# Patient Record
Sex: Female | Born: 1975
Health system: Southern US, Community
[De-identification: ages and names within clinical notes are randomized; demographics above are authoritative.]

## PROBLEM LIST (undated history)

## (undated) DIAGNOSIS — E282 Polycystic ovarian syndrome: Secondary | ICD-10-CM

## (undated) DIAGNOSIS — T8859XA Other complications of anesthesia, initial encounter: Secondary | ICD-10-CM

## (undated) DIAGNOSIS — R51 Headache: Secondary | ICD-10-CM

## (undated) DIAGNOSIS — R519 Headache, unspecified: Secondary | ICD-10-CM

## (undated) DIAGNOSIS — M1990 Unspecified osteoarthritis, unspecified site: Secondary | ICD-10-CM

## (undated) DIAGNOSIS — I498 Other specified cardiac arrhythmias: Secondary | ICD-10-CM

## (undated) DIAGNOSIS — N809 Endometriosis, unspecified: Secondary | ICD-10-CM

## (undated) DIAGNOSIS — T4145XA Adverse effect of unspecified anesthetic, initial encounter: Secondary | ICD-10-CM

## (undated) DIAGNOSIS — J189 Pneumonia, unspecified organism: Secondary | ICD-10-CM

## (undated) DIAGNOSIS — Z923 Personal history of irradiation: Secondary | ICD-10-CM

## (undated) DIAGNOSIS — E049 Nontoxic goiter, unspecified: Secondary | ICD-10-CM

## (undated) DIAGNOSIS — C801 Malignant (primary) neoplasm, unspecified: Secondary | ICD-10-CM

## (undated) DIAGNOSIS — Z9221 Personal history of antineoplastic chemotherapy: Secondary | ICD-10-CM

## (undated) DIAGNOSIS — D612 Aplastic anemia due to other external agents: Secondary | ICD-10-CM

## (undated) DIAGNOSIS — D649 Anemia, unspecified: Secondary | ICD-10-CM

## (undated) DIAGNOSIS — L4050 Arthropathic psoriasis, unspecified: Secondary | ICD-10-CM

## (undated) HISTORY — PX: MOUTH SURGERY: SHX715

## (undated) HISTORY — PX: ADENOIDECTOMY: SUR15

## (undated) HISTORY — PX: MYRINGOTOMY: SUR874

## (undated) HISTORY — PX: DILATION AND EVACUATION: SHX1459

## (undated) HISTORY — PX: ENDOMETRIAL ABLATION: SHX621

## (undated) HISTORY — PX: DIAGNOSTIC LAPAROSCOPY: SUR761

## (undated) HISTORY — PX: DILATION AND CURETTAGE OF UTERUS: SHX78

## (undated) HISTORY — PX: TONSILLECTOMY: SUR1361

---

## 2016-09-04 ENCOUNTER — Emergency Department (HOSPITAL_COMMUNITY)
Admission: EM | Admit: 2016-09-04 | Discharge: 2016-09-04 | Disposition: A | Discharge status: Home / Self Care | Payer: 59 | Attending: Emergency Medicine | Admitting: Emergency Medicine

## 2016-09-04 ENCOUNTER — Encounter (HOSPITAL_COMMUNITY): Payer: Self-pay

## 2016-09-04 DIAGNOSIS — M549 Dorsalgia, unspecified: Secondary | ICD-10-CM | POA: Insufficient documentation

## 2016-09-04 DIAGNOSIS — Z5321 Procedure and treatment not carried out due to patient leaving prior to being seen by health care provider: Secondary | ICD-10-CM | POA: Insufficient documentation

## 2016-09-04 HISTORY — DX: Unspecified osteoarthritis, unspecified site: M19.90

## 2016-09-04 HISTORY — DX: Polycystic ovarian syndrome: E28.2

## 2016-09-04 LAB — URINALYSIS, ROUTINE W REFLEX MICROSCOPIC
Bilirubin Urine: NEGATIVE
GLUCOSE, UA: NEGATIVE mg/dL
Nitrite: POSITIVE — AB
PH: 6.5 (ref 5.0–8.0)
Specific Gravity, Urine: 1.01 (ref 1.005–1.030)

## 2016-09-04 LAB — URINE MICROSCOPIC-ADD ON

## 2016-09-04 NOTE — ED Triage Notes (Signed)
Pt c/o back pain that started on Wednesday. Pt stated she started her period yesterday and ever since the pain has gotten worse. Pt states "it feels like a crapping, stabbing pain in my ovaries"

## 2016-09-04 NOTE — ED Notes (Signed)
Pt was called at triage x 3 without answer

## 2016-12-10 ENCOUNTER — Other Ambulatory Visit: Payer: Self-pay | Admitting: *Deleted

## 2016-12-10 MED ORDER — TRAMADOL HCL 50 MG PO TABS: 50.0000 mg | ORAL_TABLET | Freq: Four times a day (QID) | ORAL | 0 refills | Status: AC | PRN

## 2016-12-10 NOTE — Progress Notes (Signed)
May fill one time, needs appointment

## 2016-12-10 NOTE — Progress Notes (Signed)
Patient is requesting a refill on tramadol 50mg  last filled 11/07/2016. Patient has not been seen here yet. Please advise    Rx called into pharmacy.

## 2017-04-01 ENCOUNTER — Other Ambulatory Visit: Payer: Self-pay | Admitting: Endocrinology

## 2017-04-01 DIAGNOSIS — E049 Nontoxic goiter, unspecified: Secondary | ICD-10-CM

## 2017-04-09 ENCOUNTER — Ambulatory Visit
Admission: RE | Admit: 2017-04-09 | Discharge: 2017-04-09 | Disposition: A | Discharge status: Home / Self Care | Payer: 59 | Source: Ambulatory Visit | Attending: Endocrinology | Admitting: Endocrinology

## 2017-04-09 DIAGNOSIS — E049 Nontoxic goiter, unspecified: Secondary | ICD-10-CM

## 2017-04-20 NOTE — Patient Instructions (Signed)
Your procedure is scheduled on:  Wednesday, Apr 28, 2017  Enter through the [employer] [facility] at:  6:00 AM  Pick up the phone at the desk and dial [phone].  Call this number if you have problems the morning of surgery: [phone].  Remember: Do NOT eat food or drink after: Midnight Tuesday  Take these medicines the morning of surgery with a SIP OF WATER:  None  Stop ALL herbal medications at this time  Do NOT smoke the day of surgery.  Do NOT wear jewelry (body piercing), metal hair clips/bobby pins, make-up, or nail polish. Do NOT wear lotions, powders, or perfumes.  You may wear deodorant. Do NOT shave for 48 hours prior to surgery. Do NOT bring valuables to the hospital. Contacts, dentures, or bridgework may not be worn into surgery.  Leave suitcase in car.  After surgery it may be brought to your room.  For patients admitted to the hospital, checkout time is 11:00 AM the day of discharge.   Bring a copy of your healthcare power of attorney and living will documents.

## 2017-04-20 NOTE — H&P (Signed)
Yolanda Lawson is an 41 y.o. female. She has a history of postcoital bleeding and irregular menses. She had a negative emb; LGSIL on pap for past few years - stable. Pt has completed childbearing and requests definitive treatment. All risks/benefits and alternatives were reviewed with her and questions answered. She has known multinodular thyroid - nl labs - being followed by endocrine. She also had a rheumatoid disorder ( arthritis) - holding off on starting on biologics till after surgery.   Pertinent Gynecological History: Menses: irregular occurring approximately every few days with spotting approximately 5 days per month and after intercourse Bleeding: intermenstrual bleeding and postcoital Contraception: none DES exposure: denies Blood transfusions: none Sexually transmitted diseases: no past history Previous GYN Procedures: none  Last mammogram: normal Date: 02/2017 Last pap: abnormal: LGSIL Date: 02/2017 OB History: G0, P0   Menstrual History: Menarche age: 33 No LMP recorded.    Past Medical History:  Diagnosis Date  . Arthritis   . Polycystic ovaries     Past Surgical History:  Procedure Laterality Date  . ENDOMETRIAL ABLATION    . TONSILLECTOMY      No family history on file.  Social History:  has no tobacco, alcohol, and drug history on file.  Allergies:  Allergies  Allergen Reactions  . Nsaids Nausea And Vomiting and Other (See Comments)    "STOMACH BURNS"--INCLUDING ASPIRIN  . Stadol [Butorphanol] Nausea And Vomiting and Rash    No prescriptions prior to admission.    Review of Systems  Constitutional: Positive for malaise/fatigue. Negative for chills, fever and weight loss.  Eyes: Negative for blurred vision.  Respiratory: Negative for shortness of breath.   Cardiovascular: Negative for chest pain.  Gastrointestinal: Negative for abdominal pain, heartburn, nausea and vomiting.  Musculoskeletal: Negative for myalgias.  Skin: Negative for itching and  rash.  Neurological: Negative for dizziness, tingling and headaches.  Endo/Heme/Allergies: Does not bruise/bleed easily.  Psychiatric/Behavioral: Negative for depression, hallucinations, substance abuse and suicidal ideas. The patient is nervous/anxious.     There were no vitals taken for this visit. Physical Exam  Constitutional: She is oriented to person, place, and time. She appears well-developed.  HENT:  Head: Normocephalic.  Eyes: Pupils are equal, round, and reactive to light.  Neck: Normal range of motion.  Cardiovascular: Normal rate.   Respiratory: Effort normal.  GI: Soft. Bowel sounds are normal.  Genitourinary: Vagina normal and uterus normal.  Musculoskeletal: Normal range of motion.  Neurological: She is alert and oriented to person, place, and time.  Skin: Skin is warm and dry.  Psychiatric: She has a normal mood and affect. Her behavior is normal. Judgment and thought content normal.    No results found for this or any previous visit (from the past 24 hour(s)).  No results found.  Assessment/Plan: 41yo with AUB ( intermenstrual and postcoital) requiring definitve treatment Consent obtained  All questions answered R/B reviewed TO OR for Total laparoscopic hysterectomy, bilateral salpingectomy, possible unilateral or bilateral oopherectomy, possible open, cystoscopy when ready  Yolanda Lawson 04/20/2017, 9:02 AM

## 2017-04-21 ENCOUNTER — Encounter (HOSPITAL_COMMUNITY): Payer: Self-pay

## 2017-04-21 ENCOUNTER — Encounter (HOSPITAL_COMMUNITY)
Admission: RE | Admit: 2017-04-21 | Discharge: 2017-04-21 | Disposition: A | Discharge status: Home / Self Care | Payer: 59 | Source: Ambulatory Visit | Attending: Obstetrics and Gynecology | Admitting: Obstetrics and Gynecology

## 2017-04-21 ENCOUNTER — Other Ambulatory Visit (HOSPITAL_COMMUNITY): Payer: 59

## 2017-04-21 DIAGNOSIS — D611 Drug-induced aplastic anemia: Secondary | ICD-10-CM | POA: Insufficient documentation

## 2017-04-21 DIAGNOSIS — E282 Polycystic ovarian syndrome: Secondary | ICD-10-CM | POA: Diagnosis not present

## 2017-04-21 DIAGNOSIS — Z923 Personal history of irradiation: Secondary | ICD-10-CM | POA: Diagnosis not present

## 2017-04-21 DIAGNOSIS — R51 Headache: Secondary | ICD-10-CM | POA: Diagnosis not present

## 2017-04-21 DIAGNOSIS — I498 Other specified cardiac arrhythmias: Secondary | ICD-10-CM | POA: Diagnosis not present

## 2017-04-21 DIAGNOSIS — Z8541 Personal history of malignant neoplasm of cervix uteri: Secondary | ICD-10-CM | POA: Diagnosis not present

## 2017-04-21 DIAGNOSIS — Z01812 Encounter for preprocedural laboratory examination: Secondary | ICD-10-CM | POA: Diagnosis not present

## 2017-04-21 DIAGNOSIS — N809 Endometriosis, unspecified: Secondary | ICD-10-CM | POA: Diagnosis not present

## 2017-04-21 DIAGNOSIS — E049 Nontoxic goiter, unspecified: Secondary | ICD-10-CM | POA: Insufficient documentation

## 2017-04-21 HISTORY — DX: Nontoxic goiter, unspecified: E04.9

## 2017-04-21 HISTORY — DX: Endometriosis, unspecified: N80.9

## 2017-04-21 HISTORY — DX: Other complications of anesthesia, initial encounter: T88.59XA

## 2017-04-21 HISTORY — DX: Personal history of antineoplastic chemotherapy: Z92.21

## 2017-04-21 HISTORY — DX: Malignant (primary) neoplasm, unspecified: C80.1

## 2017-04-21 HISTORY — DX: Pneumonia, unspecified organism: J18.9

## 2017-04-21 HISTORY — DX: Headache: R51

## 2017-04-21 HISTORY — DX: Aplastic anemia due to other external agents: D61.2

## 2017-04-21 HISTORY — DX: Other specified cardiac arrhythmias: I49.8

## 2017-04-21 HISTORY — DX: Anemia, unspecified: D64.9

## 2017-04-21 HISTORY — DX: Adverse effect of unspecified anesthetic, initial encounter: T41.45XA

## 2017-04-21 HISTORY — DX: Arthropathic psoriasis, unspecified: L40.50

## 2017-04-21 HISTORY — DX: Headache, unspecified: R51.9

## 2017-04-21 HISTORY — DX: Personal history of irradiation: Z92.3

## 2017-04-21 LAB — CBC WITH DIFFERENTIAL/PLATELET
BASOS ABS: 0.1 10*3/uL (ref 0.0–0.1)
BASOS PCT: 1 %
Eosinophils Absolute: 0.2 10*3/uL (ref 0.0–0.7)
Eosinophils Relative: 2 %
HCT: 43.1 % (ref 36.0–46.0)
Hemoglobin: 14.8 g/dL (ref 12.0–15.0)
LYMPHS PCT: 34 %
Lymphs Abs: 3.6 10*3/uL (ref 0.7–4.0)
MCH: 30.6 pg (ref 26.0–34.0)
MCHC: 34.3 g/dL (ref 30.0–36.0)
MCV: 89 fL (ref 78.0–100.0)
MONO ABS: 0.6 10*3/uL (ref 0.1–1.0)
Monocytes Relative: 6 %
Neutro Abs: 6.3 10*3/uL (ref 1.7–7.7)
Neutrophils Relative %: 57 %
Platelets: 327 10*3/uL (ref 150–400)
RBC: 4.84 MIL/uL (ref 3.87–5.11)
RDW: 13.3 % (ref 11.5–15.5)
WBC: 10.8 10*3/uL — AB (ref 4.0–10.5)

## 2017-04-21 LAB — BASIC METABOLIC PANEL
Anion gap: 5 (ref 5–15)
BUN: 7 mg/dL (ref 6–20)
CHLORIDE: 101 mmol/L (ref 101–111)
CO2: 29 mmol/L (ref 22–32)
CREATININE: 0.64 mg/dL (ref 0.44–1.00)
Calcium: 9.9 mg/dL (ref 8.9–10.3)
GFR calc Af Amer: >60 mL/min (ref >60)
GFR calc non Af Amer: >60 mL/min (ref >60)
Glucose, Bld: 98 mg/dL (ref 65–99)
POTASSIUM: 4.5 mmol/L (ref 3.5–5.1)
Sodium: 135 mmol/L (ref 135–145)

## 2017-04-21 LAB — TYPE AND SCREEN
ABO/RH(D): O NEG
ANTIBODY SCREEN: NEGATIVE

## 2017-04-21 LAB — ABO/RH: ABO/RH(D): O NEG

## 2017-04-28 ENCOUNTER — Ambulatory Visit (HOSPITAL_COMMUNITY): Payer: 59 | Admitting: Anesthesiology

## 2017-04-28 ENCOUNTER — Ambulatory Visit (HOSPITAL_COMMUNITY)
Admission: RE | Admit: 2017-04-28 | Discharge: 2017-04-29 | Disposition: A | Discharge status: Home / Self Care | Payer: 59 | Source: Ambulatory Visit | Attending: Obstetrics and Gynecology | Admitting: Obstetrics and Gynecology

## 2017-04-28 ENCOUNTER — Encounter (HOSPITAL_COMMUNITY)
Admission: RE | Disposition: A | Discharge status: Home / Self Care | Payer: Self-pay | Source: Ambulatory Visit | Attending: Obstetrics and Gynecology

## 2017-04-28 ENCOUNTER — Encounter (HOSPITAL_COMMUNITY): Payer: Self-pay | Admitting: *Deleted

## 2017-04-28 DIAGNOSIS — N939 Abnormal uterine and vaginal bleeding, unspecified: Secondary | ICD-10-CM | POA: Diagnosis present

## 2017-04-28 DIAGNOSIS — Z90710 Acquired absence of both cervix and uterus: Secondary | ICD-10-CM

## 2017-04-28 DIAGNOSIS — N800 Endometriosis of uterus: Secondary | ICD-10-CM | POA: Insufficient documentation

## 2017-04-28 DIAGNOSIS — F17200 Nicotine dependence, unspecified, uncomplicated: Secondary | ICD-10-CM | POA: Insufficient documentation

## 2017-04-28 DIAGNOSIS — Z888 Allergy status to other drugs, medicaments and biological substances status: Secondary | ICD-10-CM | POA: Insufficient documentation

## 2017-04-28 DIAGNOSIS — M1990 Unspecified osteoarthritis, unspecified site: Secondary | ICD-10-CM | POA: Insufficient documentation

## 2017-04-28 HISTORY — PX: LAPAROSCOPIC HYSTERECTOMY: SHX1926

## 2017-04-28 HISTORY — PX: LAPAROSCOPIC BILATERAL SALPINGECTOMY: SHX5889

## 2017-04-28 LAB — PREGNANCY, URINE: Preg Test, Ur: NEGATIVE

## 2017-04-28 SURGERY — HYSTERECTOMY, TOTAL, LAPAROSCOPIC
Anesthesia: General | Site: Abdomen

## 2017-04-28 MED ORDER — DIPHENHYDRAMINE HCL 12.5 MG/5ML PO ELIX: 12.5000 mg | ORAL_SOLUTION | Freq: Four times a day (QID) | ORAL | Status: DC | PRN

## 2017-04-28 MED ORDER — BUPIVACAINE HCL (PF) 0.25 % IJ SOLN
INTRAMUSCULAR | Status: AC
  Filled 2017-04-28: qty 30

## 2017-04-28 MED ORDER — FENTANYL CITRATE (PF) 250 MCG/5ML IJ SOLN
INTRAMUSCULAR | Status: AC
  Filled 2017-04-28: qty 5

## 2017-04-28 MED ORDER — SUGAMMADEX SODIUM 200 MG/2ML IV SOLN
INTRAVENOUS | Status: DC | PRN
  Administered 2017-04-28: 100 mg via INTRAVENOUS

## 2017-04-28 MED ORDER — LIDOCAINE HCL (PF) 1 % IJ SOLN
INTRAMUSCULAR | Status: AC
  Filled 2017-04-28: qty 5

## 2017-04-28 MED ORDER — ACETAMINOPHEN 160 MG/5ML PO SOLN
ORAL | Status: AC
  Filled 2017-04-28: qty 40.6

## 2017-04-28 MED ORDER — BUPIVACAINE HCL (PF) 0.25 % IJ SOLN
INTRAMUSCULAR | Status: DC | PRN
  Administered 2017-04-28: 14 mL

## 2017-04-28 MED ORDER — TRAMADOL HCL 50 MG PO TABS
50.0000 mg | ORAL_TABLET | Freq: Four times a day (QID) | ORAL | Status: DC
  Administered 2017-04-28: 50 mg via ORAL
  Filled 2017-04-28: qty 1

## 2017-04-28 MED ORDER — TRAMADOL HCL 50 MG PO TABS
50.0000 mg | ORAL_TABLET | Freq: Four times a day (QID) | ORAL | Status: DC
  Administered 2017-04-29: 50 mg via ORAL
  Filled 2017-04-28: qty 1

## 2017-04-28 MED ORDER — ONDANSETRON HCL 4 MG/2ML IJ SOLN
4.0000 mg | Freq: Four times a day (QID) | INTRAMUSCULAR | Status: DC | PRN
  Administered 2017-04-28: 4 mg via INTRAVENOUS
  Filled 2017-04-28: qty 2

## 2017-04-28 MED ORDER — ACETAMINOPHEN-CODEINE #3 300-30 MG PO TABS: 1.0000 | ORAL_TABLET | ORAL | Status: DC | PRN

## 2017-04-28 MED ORDER — LACTATED RINGERS IR SOLN
Status: DC | PRN
  Administered 2017-04-28: 3000 mL

## 2017-04-28 MED ORDER — HYDROMORPHONE HCL 1 MG/ML IJ SOLN
INTRAMUSCULAR | Status: AC
  Administered 2017-04-28: 0.5 mg via INTRAVENOUS
  Filled 2017-04-28: qty 1

## 2017-04-28 MED ORDER — SUGAMMADEX SODIUM 200 MG/2ML IV SOLN
INTRAVENOUS | Status: AC
  Filled 2017-04-28: qty 2

## 2017-04-28 MED ORDER — DIPHENHYDRAMINE HCL 50 MG/ML IJ SOLN: 12.5000 mg | Freq: Four times a day (QID) | INTRAMUSCULAR | Status: DC | PRN

## 2017-04-28 MED ORDER — ONDANSETRON HCL 4 MG/2ML IJ SOLN: 4.0000 mg | Freq: Four times a day (QID) | INTRAMUSCULAR | Status: DC | PRN

## 2017-04-28 MED ORDER — FENTANYL CITRATE (PF) 100 MCG/2ML IJ SOLN
INTRAMUSCULAR | Status: DC | PRN
  Administered 2017-04-28 (×3): 50 ug via INTRAVENOUS

## 2017-04-28 MED ORDER — HYDROMORPHONE 1 MG/ML IV SOLN
INTRAVENOUS | Status: DC
  Filled 2017-04-28: qty 25

## 2017-04-28 MED ORDER — HYDROMORPHONE HCL 1 MG/ML IJ SOLN
INTRAMUSCULAR | Status: AC
  Filled 2017-04-28: qty 1

## 2017-04-28 MED ORDER — SODIUM CHLORIDE 0.9% FLUSH: 9.0000 mL | INTRAVENOUS | Status: DC | PRN

## 2017-04-28 MED ORDER — ACETAMINOPHEN 160 MG/5ML PO SOLN
975.0000 mg | Freq: Once | ORAL | Status: AC
  Administered 2017-04-28: 975 mg via ORAL

## 2017-04-28 MED ORDER — HYDROMORPHONE 1 MG/ML IV SOLN: INTRAVENOUS | Status: DC

## 2017-04-28 MED ORDER — ROCURONIUM BROMIDE 100 MG/10ML IV SOLN
INTRAVENOUS | Status: DC | PRN
  Administered 2017-04-28 (×2): 10 mg via INTRAVENOUS
  Administered 2017-04-28: 40 mg via INTRAVENOUS

## 2017-04-28 MED ORDER — DEXAMETHASONE SODIUM PHOSPHATE 10 MG/ML IJ SOLN
INTRAMUSCULAR | Status: DC | PRN
  Administered 2017-04-28: 4 mg via INTRAVENOUS

## 2017-04-28 MED ORDER — SCOPOLAMINE 1 MG/3DAYS TD PT72
MEDICATED_PATCH | TRANSDERMAL | Status: AC
  Administered 2017-04-28: 1.5 mg via TRANSDERMAL
  Filled 2017-04-28: qty 1

## 2017-04-28 MED ORDER — DEXAMETHASONE SODIUM PHOSPHATE 4 MG/ML IJ SOLN
INTRAMUSCULAR | Status: AC
  Filled 2017-04-28: qty 1

## 2017-04-28 MED ORDER — SCOPOLAMINE 1 MG/3DAYS TD PT72
1.0000 | MEDICATED_PATCH | Freq: Once | TRANSDERMAL | Status: DC
  Administered 2017-04-28: 1.5 mg via TRANSDERMAL

## 2017-04-28 MED ORDER — CEFAZOLIN SODIUM-DEXTROSE 2-4 GM/100ML-% IV SOLN
2.0000 g | INTRAVENOUS | Status: AC
  Administered 2017-04-28: 2 g via INTRAVENOUS

## 2017-04-28 MED ORDER — LACTATED RINGERS IV SOLN
INTRAVENOUS | Status: DC
  Administered 2017-04-28 (×2): via INTRAVENOUS

## 2017-04-28 MED ORDER — PROPOFOL 10 MG/ML IV BOLUS
INTRAVENOUS | Status: AC
  Filled 2017-04-28: qty 20

## 2017-04-28 MED ORDER — LIDOCAINE HCL (CARDIAC) 20 MG/ML IV SOLN
INTRAVENOUS | Status: DC | PRN
  Administered 2017-04-28: 40 mg via INTRAVENOUS

## 2017-04-28 MED ORDER — LACTATED RINGERS IV SOLN
INTRAVENOUS | Status: DC
  Administered 2017-04-28 (×2): via INTRAVENOUS

## 2017-04-28 MED ORDER — NALOXONE HCL 0.4 MG/ML IJ SOLN: 0.4000 mg | INTRAMUSCULAR | Status: DC | PRN

## 2017-04-28 MED ORDER — MIDAZOLAM HCL 2 MG/2ML IJ SOLN
INTRAMUSCULAR | Status: AC
  Filled 2017-04-28: qty 2

## 2017-04-28 MED ORDER — OXYCODONE-ACETAMINOPHEN 5-325 MG PO TABS
1.0000 | ORAL_TABLET | ORAL | Status: DC | PRN
Start: 2017-04-28 — End: 2017-04-29
  Administered 2017-04-28: 1 via ORAL
  Administered 2017-04-29: 2 via ORAL
  Administered 2017-04-29: 1 via ORAL
  Filled 2017-04-28 (×2): qty 1
  Filled 2017-04-28: qty 2

## 2017-04-28 MED ORDER — ONDANSETRON HCL 4 MG/2ML IJ SOLN
INTRAMUSCULAR | Status: AC
  Filled 2017-04-28: qty 2

## 2017-04-28 MED ORDER — SODIUM CHLORIDE 0.9% FLUSH
9.0000 mL | INTRAVENOUS | Status: DC | PRN
Start: 2017-04-28 — End: 2017-04-29

## 2017-04-28 MED ORDER — HYDROMORPHONE 1 MG/ML IV SOLN
INTRAVENOUS | Status: DC
  Administered 2017-04-28: 12:00:00 via INTRAVENOUS
  Administered 2017-04-28: 1.2 mg via INTRAVENOUS
  Filled 2017-04-28: qty 25

## 2017-04-28 MED ORDER — HYDROMORPHONE HCL 1 MG/ML IJ SOLN
0.2500 mg | INTRAMUSCULAR | Status: DC | PRN
  Administered 2017-04-28 (×3): 0.5 mg via INTRAVENOUS

## 2017-04-28 MED ORDER — PROPOFOL 10 MG/ML IV BOLUS
INTRAVENOUS | Status: DC | PRN
  Administered 2017-04-28: 180 mg via INTRAVENOUS

## 2017-04-28 MED ORDER — ONDANSETRON HCL 4 MG/2ML IJ SOLN
INTRAMUSCULAR | Status: DC | PRN
  Administered 2017-04-28: 4 mg via INTRAVENOUS

## 2017-04-28 MED ORDER — MIDAZOLAM HCL 2 MG/2ML IJ SOLN
INTRAMUSCULAR | Status: DC | PRN
  Administered 2017-04-28 (×2): 1 mg via INTRAVENOUS

## 2017-04-28 MED ORDER — PROPRANOLOL HCL 60 MG PO TABS
120.0000 mg | ORAL_TABLET | Freq: Every day | ORAL | Status: DC
  Administered 2017-04-28: 120 mg via ORAL
  Filled 2017-04-28: qty 2

## 2017-04-28 SURGICAL SUPPLY — 49 items
BARRIER ADHS 3X4 INTERCEED (GAUZE/BANDAGES/DRESSINGS) IMPLANT
CABLE HIGH FREQUENCY MONO STRZ (ELECTRODE) IMPLANT
CLOTH BEACON ORANGE TIMEOUT ST (SAFETY) ×5 IMPLANT
COVER BACK TABLE 60X90IN (DRAPES) ×5 IMPLANT
COVER LIGHT HANDLE  1/PK (MISCELLANEOUS) ×4
COVER LIGHT HANDLE 1/PK (MISCELLANEOUS) ×6 IMPLANT
COVER MAYO STAND STRL (DRAPES) ×5 IMPLANT
DERMABOND ADVANCED (GAUZE/BANDAGES/DRESSINGS) ×2
DERMABOND ADVANCED .7 DNX12 (GAUZE/BANDAGES/DRESSINGS) ×3 IMPLANT
DEVICE SUTURE ENDOST 10MM (ENDOMECHANICALS) ×5 IMPLANT
DURAPREP 26ML APPLICATOR (WOUND CARE) ×5 IMPLANT
FILTER SMOKE EVAC LAPAROSHD (FILTER) ×5 IMPLANT
GLOVE BIO SURGEON STRL SZ 6.5 (GLOVE) ×8 IMPLANT
GLOVE BIO SURGEONS STRL SZ 6.5 (GLOVE) ×2
GLOVE BIOGEL PI IND STRL 7.0 (GLOVE) ×9 IMPLANT
GLOVE BIOGEL PI INDICATOR 7.0 (GLOVE) ×6
GOWN STRL REUS W/TWL LRG LVL3 (GOWN DISPOSABLE) ×15 IMPLANT
NS IRRIG 1000ML POUR BTL (IV SOLUTION) ×5 IMPLANT
OCCLUDER COLPOPNEUMO (BALLOONS) ×5 IMPLANT
PACK LAPAROSCOPY BASIN (CUSTOM PROCEDURE TRAY) ×5 IMPLANT
PACK LAVH (CUSTOM PROCEDURE TRAY) IMPLANT
PACK TRENDGUARD 450 HYBRID PRO (MISCELLANEOUS) ×3 IMPLANT
PACK TRENDGUARD 600 HYBRD PROC (MISCELLANEOUS) IMPLANT
PROTECTOR NERVE ULNAR (MISCELLANEOUS) ×10 IMPLANT
SCISSORS LAP 5X35 DISP (ENDOMECHANICALS) IMPLANT
SET CYSTO W/LG BORE CLAMP LF (SET/KITS/TRAYS/PACK) IMPLANT
SET IRRIG TUBING LAPAROSCOPIC (IRRIGATION / IRRIGATOR) ×5 IMPLANT
SHEARS HARMONIC ACE PLUS 36CM (ENDOMECHANICALS) ×5 IMPLANT
SLEEVE XCEL OPT CAN 5 100 (ENDOMECHANICALS) ×5 IMPLANT
SUT ENDO VLOC 180-0-8IN (SUTURE) ×5 IMPLANT
SUT VIC AB 0 CT1 27 (SUTURE) ×4
SUT VIC AB 0 CT1 27XBRD ANBCTR (SUTURE) ×6 IMPLANT
SUT VIC AB 4-0 PS2 27 (SUTURE) ×5 IMPLANT
SUT VICRYL 0 UR6 27IN ABS (SUTURE) ×5 IMPLANT
SYR TOOMEY 50ML (SYRINGE) IMPLANT
SYRINGE 10CC LL (SYRINGE) ×5 IMPLANT
SYSTEM CARTER THOMASON II (TROCAR) ×5 IMPLANT
TIP UTERINE 5.1X6CM LAV DISP (MISCELLANEOUS) IMPLANT
TIP UTERINE 6.7X10CM GRN DISP (MISCELLANEOUS) IMPLANT
TIP UTERINE 6.7X6CM WHT DISP (MISCELLANEOUS) IMPLANT
TIP UTERINE 6.7X8CM BLUE DISP (MISCELLANEOUS) ×5 IMPLANT
TOWEL OR 17X24 6PK STRL BLUE (TOWEL DISPOSABLE) ×10 IMPLANT
TRAY FOLEY CATH SILVER 14FR (SET/KITS/TRAYS/PACK) ×5 IMPLANT
TRENDGUARD 450 HYBRID PRO PACK (MISCELLANEOUS) ×5
TRENDGUARD 600 HYBRID PROC PK (MISCELLANEOUS)
TROCAR OPTI TIP 5M 100M (ENDOMECHANICALS) IMPLANT
TROCAR XCEL NON-BLD 11X100MML (ENDOMECHANICALS) ×5 IMPLANT
TROCAR XCEL NON-BLD 5MMX100MML (ENDOMECHANICALS) ×5 IMPLANT
WARMER LAPAROSCOPE (MISCELLANEOUS) ×5 IMPLANT

## 2017-04-28 NOTE — Interval H&P Note (Signed)
History and Physical Interval Note:  04/28/2017 7:23 AM  [name]  has presented today for surgery, with the diagnosis of dysfuctional uterine bleeding  The various methods of treatment have been discussed with the patient and family. After consideration of risks, benefits and other options for treatment, the patient has consented to  Procedure(s): HYSTERECTOMY TOTAL LAPAROSCOPIC, POSS OPEN (N/A) LAPAROSCOPIC BILATERAL SALPINGECTOMY (Bilateral) CYSTOSCOPY (N/A) POSSIBLE OOPHORECTOMY (N/A) as a surgical intervention .  The patient's history has been reviewed, patient examined, no change in status, stable for surgery.  I have reviewed the patient's chart and labs.  Questions were answered to the patient's satisfaction.     Isaiah Serge

## 2017-04-28 NOTE — Op Note (Signed)
Operative Note    Preoperative Diagnosis: Abnormal uterine bleeding   Postoperative Diagnosis: Same                                               Endometriosis   Procedure: Total laparoscopic hysterectomy, bilateral salpingectomy   Surgeon: Mickle Mallory DO Assist: Meisinger, T MD Anesthesia: General Fluids: LR  EBL UOP IVF  Findings: Uterus measuring 9cm, grossly normal tubes and ovaries, minimal scattered endometriosis implants   Specimen: Uterusm cervix, bilateral fallopian tubes   Procedure Note Pt seen in pre-op. Procedure reviewed and all questions answered; consent verified  Pt taken to operating room and placed in dorsal lithotomy position with her arms safely positioned at her sides. General anesthesia was administered and found to be adequate. Pt was prepped and draped in sterile fashion and a timeout performed. A weighted speculum was placed in the posterior fornix  Excellent visualization of the cervix was obtained. Uterus was sounded to 9cm so a size 8 koh was assembled with a medium cup and placed with retention sutures at 3 and 9 o'clock. A foley catheter was also placed in a sterile fashion Legs were then lowered and attention turned to her abdomen.  Here a 38mm incision was made at the umbilicus. A 45mm optiview trocar was then placed with the abdomen tented upwards. The laparoscopic camera was used to confirm placement and pneumoperitoneum obtained with CO2 gas to 81mmHg. The patient was placed in trendelenberg and gross survey of pelvis done.The uterus was noted as described above:  At this time one more 62mm port was then placed under direct visualization in right lower quadrant and an 11 site in the left with care taken to avoid the epigastric vessels. Further exploration noted  Both ureters were visualized along lateral sidewalls.  Starting on the the patients left, the left fallopian tube was then grasped with a blunt grasper, elevated and excised using the  harmonic hemostatically. Next the utero-ovarian ligament and the round ligaments were sequentially grasped and excised. The broad ligament was then separated from the uterus as well with a bladder flap created. The cardinal ligament was then excised next at the utero-cervical junction and the ovarian vessel clamped, cauterized and cut. The same was done on the patients right with similar results.  Next the bladder reflection was dissected away. The vaginal occluder was filled with 60cc of saline and starting anteriorly and working laterally the uterus and cervix were amputated off the superior vagina. The uterus, cervix and fallopian tubes were removed.  The pelvis was irrigated and hemostasis noted. The angles of the vaginal cuff were easily seen and using the endostitch with an 0 vicryl v-lock suture, the cuff was closed in a running fashion with 5-6 back stitches to ensure closure.  Further irrigation of the pelvis confirmed no abnormalities or bleeding hence patient was flattened. The 11 port site was closed with 0-vicryl suture with a carter thomasen to ensure closure of the peritoneum.  The remaining  trocars were removed under direct visualization and gas allowed to escape.  Incision sites were closed with 4-0 vicryl suture and dermabond. Counts were noted to be correct. Patient was awakened and taken to recovery room in stable status.  Foley was left in place

## 2017-04-28 NOTE — Anesthesia Procedure Notes (Signed)
Procedure Name: Intubation Date/Time: 04/28/2017 7:36 AM Performed by: Bufford Spikes Pre-anesthesia Checklist: Patient identified, Emergency Drugs available, Suction available and Patient being monitored Patient Re-evaluated:Patient Re-evaluated prior to inductionOxygen Delivery Method: Circle system utilized Preoxygenation: Pre-oxygenation with 100% oxygen Intubation Type: IV induction Ventilation: Mask ventilation without difficulty Laryngoscope Size: Miller and 2 Grade View: Grade I Tube type: Oral Tube size: 7.0 mm Number of attempts: 1 Airway Equipment and Method: Stylet Placement Confirmation: ETT inserted through vocal cords under direct vision,  positive ETCO2 and breath sounds checked- equal and bilateral Secured at: 21 cm Tube secured with: Tape Dental Injury: Teeth and Oropharynx as per pre-operative assessment

## 2017-04-28 NOTE — Progress Notes (Addendum)
Day of Surgery Procedure(s) (LRB): HYSTERECTOMY TOTAL LAPAROSCOPIC (N/A) LAPAROSCOPIC BILATERAL SALPINGECTOMY (Bilateral)  Subjective: Patient reports incisional pain and hungry.  -pain controlled  - just feels sore. Some shoulder pain  Objective: I have reviewed patient's vital signs and intake and output.  General: alert and no distress Resp: clear to auscultation bilaterally Cardio: regular rate and rhythm GI: normal findings: bowel sounds quiet, soft, appropriately tender Extremities: sym, NT; SCDs in place  Assessment: s/p Procedure(s): HYSTERECTOMY TOTAL LAPAROSCOPIC (N/A) LAPAROSCOPIC BILATERAL SALPINGECTOMY (Bilateral): stable and progressing well  Plan: Advance diet Encourage ambulation PCA and foley  Will back PCA off to reduced dose. Po pain meds Poss d/c foley - d/w pt   LOS: 0 days    Yolanda Lawson 04/28/2017, 4:58 PM

## 2017-04-28 NOTE — Transfer of Care (Signed)
Immediate Anesthesia Transfer of Care Note  Patient: Yolanda Lawson  Procedure(s) Performed: Procedure(s): HYSTERECTOMY TOTAL LAPAROSCOPIC (N/A) LAPAROSCOPIC BILATERAL SALPINGECTOMY (Bilateral)  Patient Location: PACU  Anesthesia Type:General  Level of Consciousness: awake, alert  and sedated  Airway & Oxygen Therapy: Patient Spontanous Breathing and Patient connected to nasal cannula oxygen  Post-op Assessment: Report given to RN and Post -op Vital signs reviewed and stable  Post vital signs: Reviewed and stable  Last Vitals:  Vitals:   04/28/17 0609  BP: 124/67  Pulse: 63  Resp: 16  Temp: 37 C    Last Pain:  Vitals:   04/28/17 0609  TempSrc: Oral      Patients Stated Pain Goal: 3 (21/30/86 5784)  Complications: No apparent anesthesia complications

## 2017-04-28 NOTE — Progress Notes (Signed)
PCA Dilaudid discontinued at 2059, patient requested po pain med.  Tramadol 50mg  given.  Dilaudid PCA 65ml (20MG ) wasted in med room sink.  Witness by Evert Kohl, RN

## 2017-04-28 NOTE — Anesthesia Preprocedure Evaluation (Signed)
Anesthesia Evaluation  Patient identified by MRN, date of birth, ID band Patient awake    Reviewed: Allergy & Precautions, H&P , Patient's Chart, lab work & pertinent test results, reviewed documented beta blocker date and time   Airway Mallampati: II  TM Distance: >3 FB Neck ROM: full    Dental no notable dental hx.    Pulmonary Current Smoker,    Pulmonary exam normal breath sounds clear to auscultation       Cardiovascular  Rhythm:regular Rate:Normal     Neuro/Psych    GI/Hepatic   Endo/Other    Renal/GU      Musculoskeletal   Abdominal   Peds  Hematology   Anesthesia Other Findings   Reproductive/Obstetrics                             Anesthesia Physical Anesthesia Plan  ASA: II  Anesthesia Plan: General   Post-op Pain Management:    Induction: Intravenous  Airway Management Planned: Oral ETT  Additional Equipment:   Intra-op Plan:   Post-operative Plan: Extubation in OR  Informed Consent: I have reviewed the patients History and Physical, chart, labs and discussed the procedure including the risks, benefits and alternatives for the proposed anesthesia with the patient or authorized representative who has indicated his/her understanding and acceptance.   Dental Advisory Given  Plan Discussed with: CRNA and Surgeon  Anesthesia Plan Comments: (  )        Anesthesia Quick Evaluation

## 2017-04-29 DIAGNOSIS — N800 Endometriosis of uterus: Secondary | ICD-10-CM | POA: Diagnosis not present

## 2017-04-29 LAB — CBC
HEMATOCRIT: 34.2 % — AB (ref 36.0–46.0)
HEMOGLOBIN: 11.9 g/dL — AB (ref 12.0–15.0)
MCH: 31.1 pg (ref 26.0–34.0)
MCHC: 34.8 g/dL (ref 30.0–36.0)
MCV: 89.3 fL (ref 78.0–100.0)
Platelets: 233 10*3/uL (ref 150–400)
RBC: 3.83 MIL/uL — ABNORMAL LOW (ref 3.87–5.11)
RDW: 13.6 % (ref 11.5–15.5)
WBC: 12.5 10*3/uL — ABNORMAL HIGH (ref 4.0–10.5)

## 2017-04-29 MED ORDER — OXYCODONE-ACETAMINOPHEN 5-325 MG PO TABS: 1.0000 | ORAL_TABLET | ORAL | 0 refills | Status: AC | PRN

## 2017-04-29 MED ORDER — TRAMADOL HCL 50 MG PO TABS: 50.0000 mg | ORAL_TABLET | Freq: Four times a day (QID) | ORAL | 1 refills | Status: AC | PRN

## 2017-04-29 NOTE — Progress Notes (Signed)
Patient ID: Yolanda Lawson, female   DOB: [birth year], 41 y.o.   MRN: [id number] Pt doing well. Denies fever or chills and pain well controlled with po meds. She has voided well after foley removed and is tolerating po. Feels ready for discharge to home today VSS ABD- dressings c/d/I, sore, ND EXT - no homans  12.5>11.9<233  A/P: POD#1 s/p TLH/BSO - stable         Discharge instructions reviewed         F/u in 2 weeks for incision check and 6 weeks

## 2017-04-29 NOTE — Anesthesia Postprocedure Evaluation (Signed)
Anesthesia Post Note  Patient: Yolanda Lawson  Procedure(s) Performed: Procedure(s) (LRB): HYSTERECTOMY TOTAL LAPAROSCOPIC (N/A) LAPAROSCOPIC BILATERAL SALPINGECTOMY (Bilateral)  Patient location during evaluation: PACU Anesthesia Type: General Level of consciousness: awake and alert Pain management: pain level controlled Vital Signs Assessment: post-procedure vital signs reviewed and stable Respiratory status: spontaneous breathing, nonlabored ventilation, respiratory function stable and patient connected to nasal cannula oxygen Cardiovascular status: blood pressure returned to baseline and stable Postop Assessment: no signs of nausea or vomiting Anesthetic complications: no        Last Vitals:  Vitals:   04/29/17 0200 04/29/17 0341  BP:  (!) 103/48  Pulse: 65 61  Resp: 14 15  Temp:  36.8 C    Last Pain:  Vitals:   04/29/17 0558  TempSrc:   PainSc: 0-No pain   Pain Goal: Patients Stated Pain Goal: 3 (04/29/17 0346)               Lyndle Herrlich EDWARD

## 2017-04-29 NOTE — Discharge Instructions (Signed)
Nothing in vagina for 6 weeks.  No sex, tampons, and douching.  Other instructions as in [facility] Discharge Booklet. °

## 2017-04-29 NOTE — Discharge Summary (Signed)
Physician Discharge Summary  Patient ID: Yolanda Lawson MRN: [id number] DOB/AGE: February 17, 1976 41 y.o.  Admit date: 04/28/2017 Discharge date: 04/29/2017  Admission Diagnoses:  Discharge Diagnoses:  Active Problems:   S/P laparoscopic hysterectomy   Discharged Condition: stable  Hospital Course: Pt stayed overnight after procedure for extended recovery. She easily transitioned to PO medications and was deemed stable for discharge today  Consults: None  Significant Diagnostic Studies: cardiac graphics: Telemetry: nsr  Treatments: analgesia: Dilaudid PCA and then po ultram and percocet  Discharge Exam: Blood pressure 104/62, pulse (!) 56, temperature 97.7 F (36.5 C), temperature source Oral, resp. rate 18, height 5\' 5"  (1.651 m), weight 112 lb 2 oz (50.9 kg), last menstrual period 04/16/2017, SpO2 97 %. General appearance: alert and no distress Extremities: Homans sign is negative, no sign of DVT Incision/Wound:dressing c/d/i  Disposition: 01-Home or Self Care  Discharge Instructions     Remove dressing in 72 hours    Complete by:  As directed    Call MD for:  persistant dizziness or light-headedness    Complete by:  As directed    Call MD for:  persistant nausea and vomiting    Complete by:  As directed    Call MD for:  redness, tenderness, or signs of infection (pain, swelling, redness, odor or green/yellow discharge around incision site)    Complete by:  As directed    Call MD for:  severe uncontrolled pain    Complete by:  As directed    Call MD for:  temperature >100.4    Complete by:  As directed    Diet - low sodium heart healthy    Complete by:  As directed    Discharge instructions    Complete by:  As directed    Nothing in vagina for 6 weeks.  No sex, tampons, and douching.   Increase activity slowly    Complete by:  As directed      Allergies as of 04/29/2017      Reactions   Humira [adalimumab] Anaphylaxis   Respiratory Distress   Nsaids Nausea And  Vomiting, Other (See Comments)   "STOMACH BURNS"--INCLUDING ASPIRIN   Stadol [butorphanol] Nausea And Vomiting, Rash      Medication List    TAKE these medications   ACETAMINOPHEN-BUTALBITAL 50-325 MG Tabs Take 1 tablet by mouth every 4 (four) hours as needed for pain.   cyclobenzaprine 5 MG tablet Commonly known as:  FLEXERIL Take 5 mg by mouth daily as needed for muscle spasms.   fluticasone 50 MCG/ACT nasal spray Commonly known as:  FLONASE Place 1 spray into both nostrils daily.   ICY HOT 5 % Ptch Generic drug:  Menthol Place 1 patch onto the skin 2 (two) times daily as needed (for pain.).   multivitamin with minerals Tabs tablet Take 1 tablet by mouth daily.   oxyCODONE-acetaminophen 5-325 MG tablet Commonly known as:  PERCOCET Take 1 tablet by mouth every 4 (four) hours as needed for severe pain.   propranolol 60 MG tablet Commonly known as:  INDERAL Take 120 mg by mouth at bedtime.   traMADol 50 MG tablet Commonly known as:  ULTRAM Take 1 tablet (50 mg total) by mouth every 6 (six) hours as needed. What changed:  reasons to take this   traMADol 50 MG tablet Commonly known as:  ULTRAM Take 1 tablet (50 mg total) by mouth every 6 (six) hours as needed for moderate pain. What changed:  You were already taking a  medication with the same name, and this prescription was added. Make sure you understand how and when to take each.   Vitamin D 2000 units Caps Take 2,000 Units by mouth daily.      Follow-up Information    Hinckley, DO. Call in 2 week(s).   Specialty:  Obstetrics and Gynecology Why:  For post op visit and in 6 weeks Contact information: [address] [place] [address] [phone]           Signed: Isaiah Serge 04/29/2017, 10:14 AM

## 2017-04-29 NOTE — Progress Notes (Signed)
Patient discharged home with family. Discharge teaching, paperwork, home care, prescriptions, and follow-up appts reviewed. No questions at this time.  

## 2017-04-30 ENCOUNTER — Encounter (HOSPITAL_COMMUNITY): Payer: Self-pay | Admitting: Obstetrics and Gynecology

## 2017-07-09 NOTE — Anesthesia Postprocedure Evaluation (Signed)
Anesthesia Post Note  Patient: Yolanda Lawson  Procedure(s) Performed: Procedure(s) (LRB): HYSTERECTOMY TOTAL LAPAROSCOPIC (N/A) LAPAROSCOPIC BILATERAL SALPINGECTOMY (Bilateral)     Anesthesia Post Evaluation  Last Vitals:  Vitals:   04/29/17 0341 04/29/17 0824  BP: (!) 103/48 104/62  Pulse: 61 (!) 56  Resp: 15 18  Temp: 36.8 C 36.5 C    Last Pain:  Vitals:   04/29/17 0959  TempSrc:   PainSc: 4                  Hilja Kintzel EDWARD

## 2017-07-09 NOTE — Addendum Note (Signed)
Addendum  created 07/09/17 1209 by Lyndle Herrlich, MD   Sign clinical note

## 2017-11-18 IMAGING — US US THYROID
1 series · 13 of 25 positions shown · non-contrast
Comparison: None.

CLINICAL DATA: Palpable abnormality. Thyroid nodules by physical
exam.

EXAM:
THYROID ULTRASOUND
TECHNIQUE: Ultrasound examination of the thyroid gland and adjacent soft
tissues was performed.

[Series 1: us thyroid · 0.07mm/px · 13 of 75 slices shown]
[im 1/75]
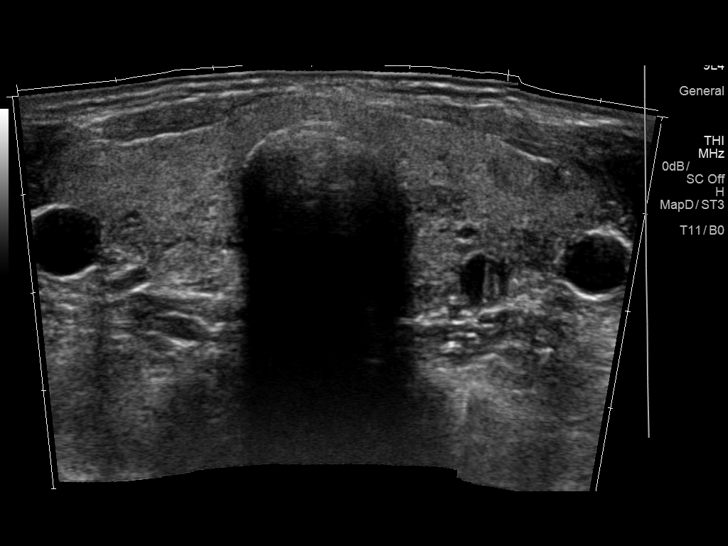
[im 7/75]
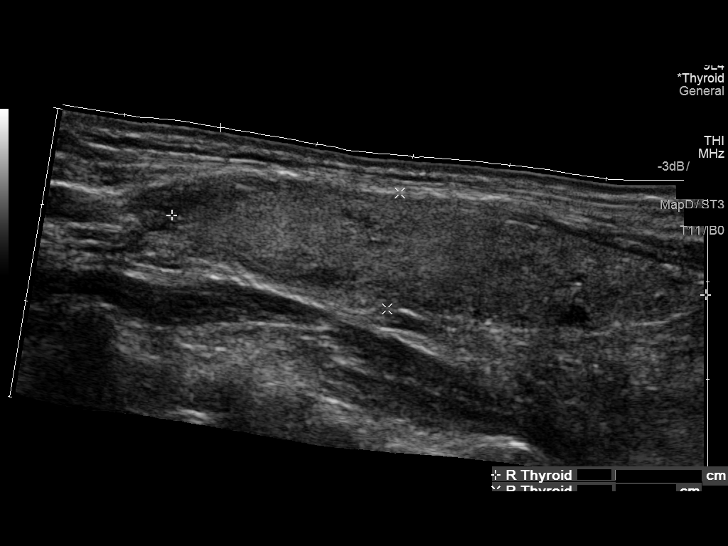
[im 13/75]
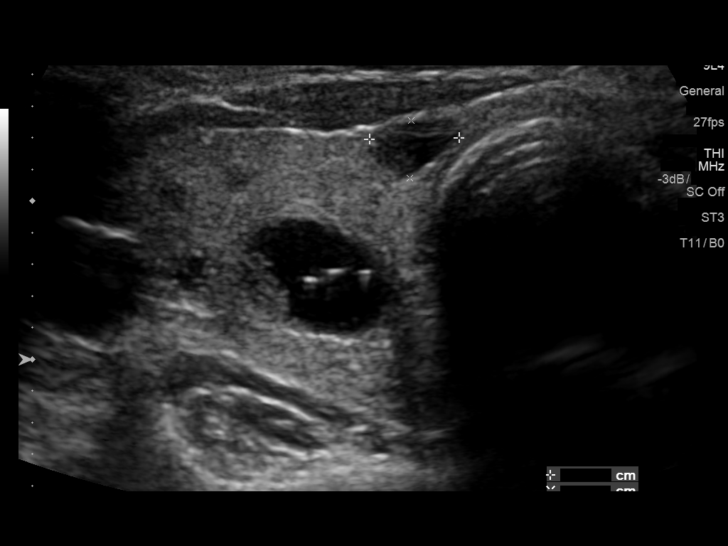
[im 19/75]
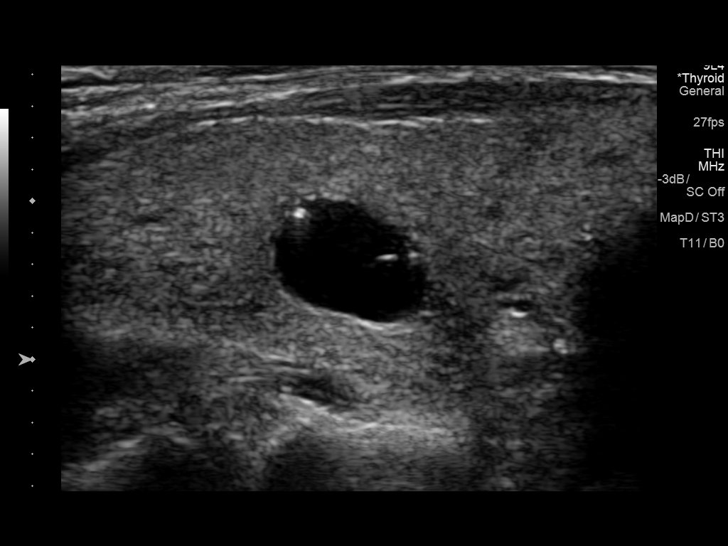
[im 25/75]
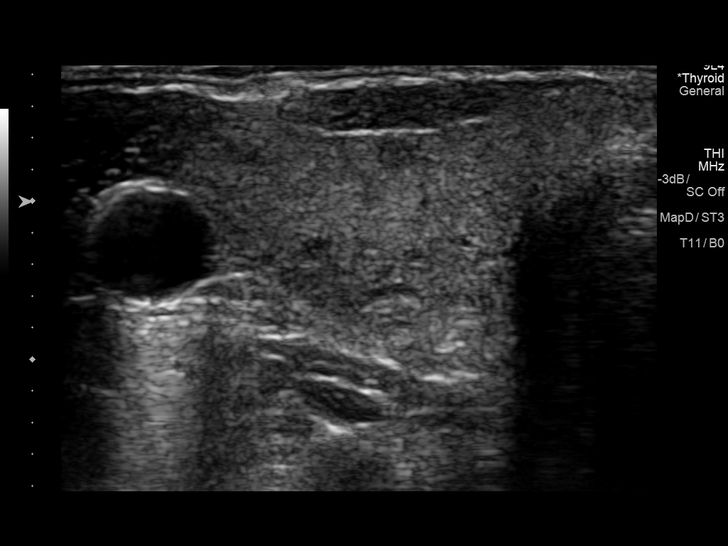
[im 31/75]
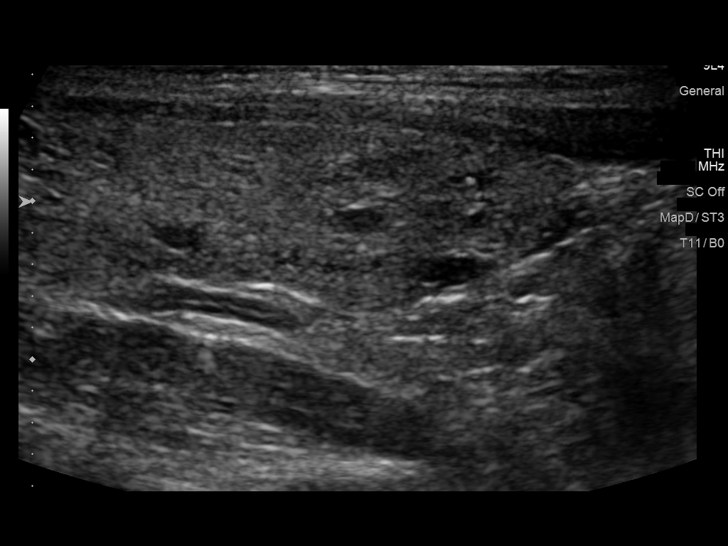
[im 38/75]
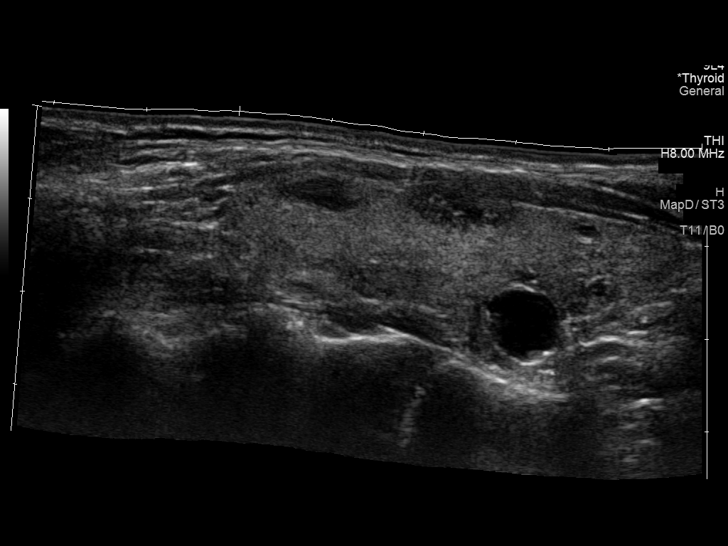
[im 44/75]
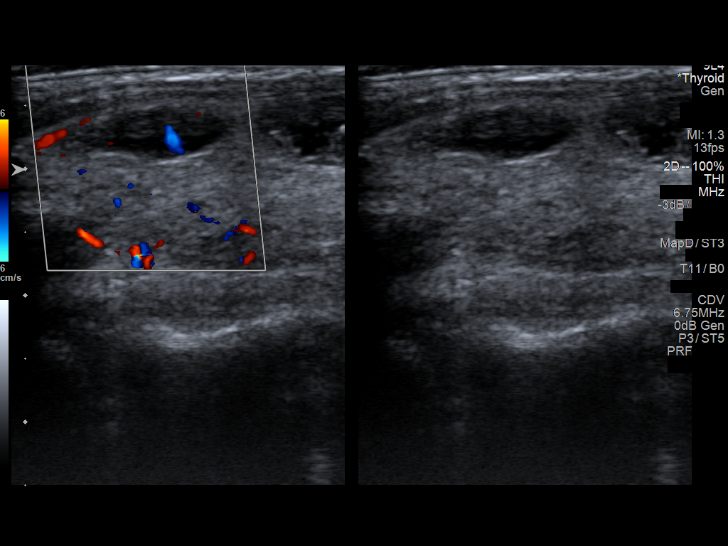
[im 50/75]
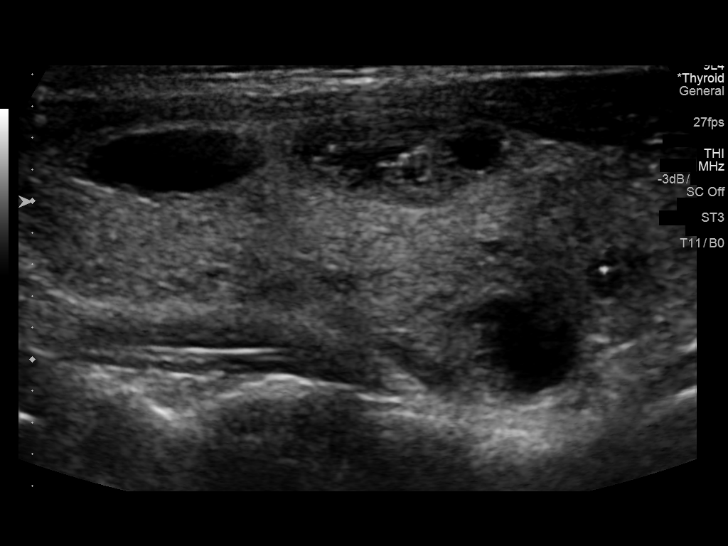
[im 56/75]
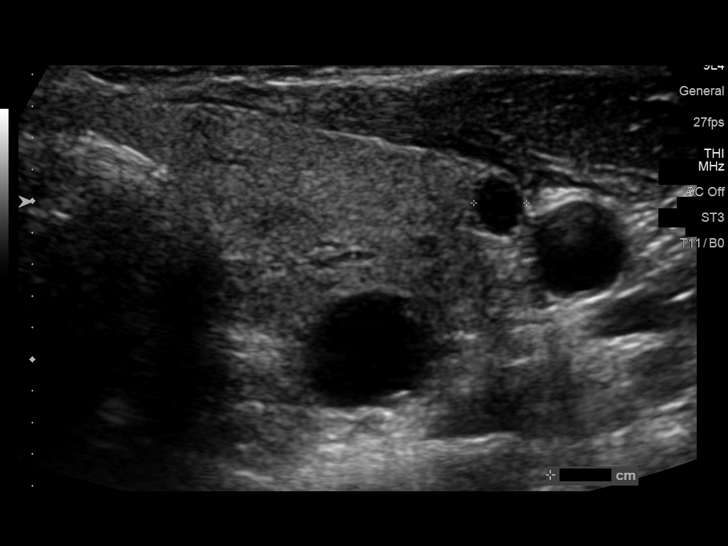
[im 62/75]
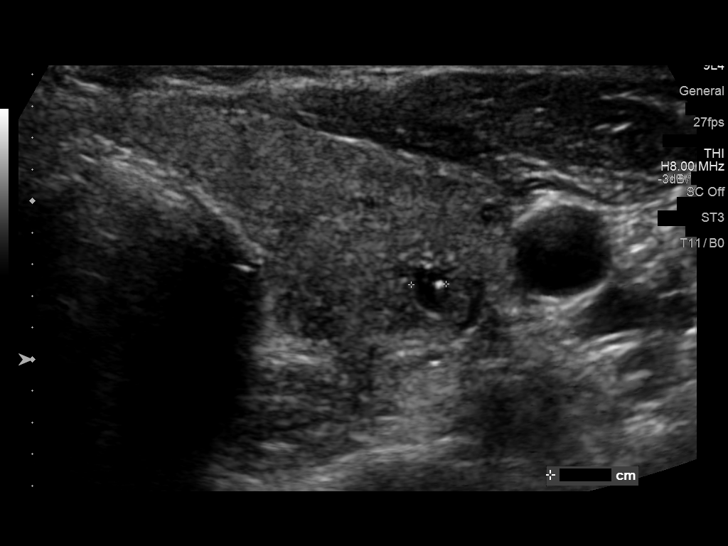
[im 68/75]
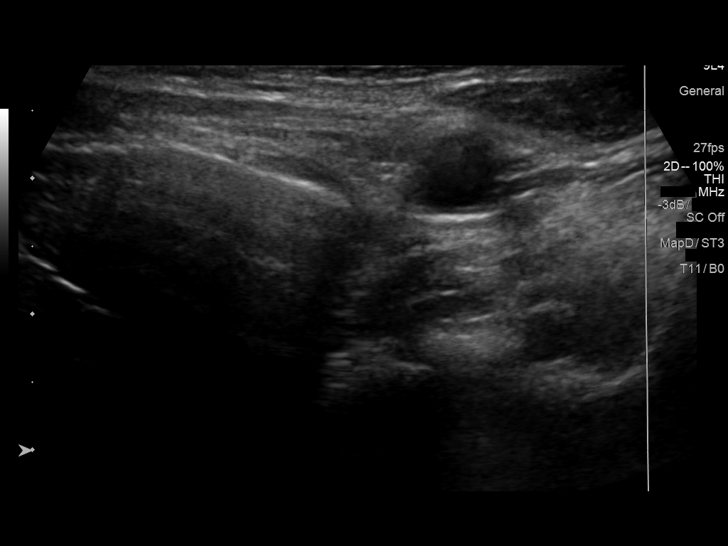
[im 75/75]
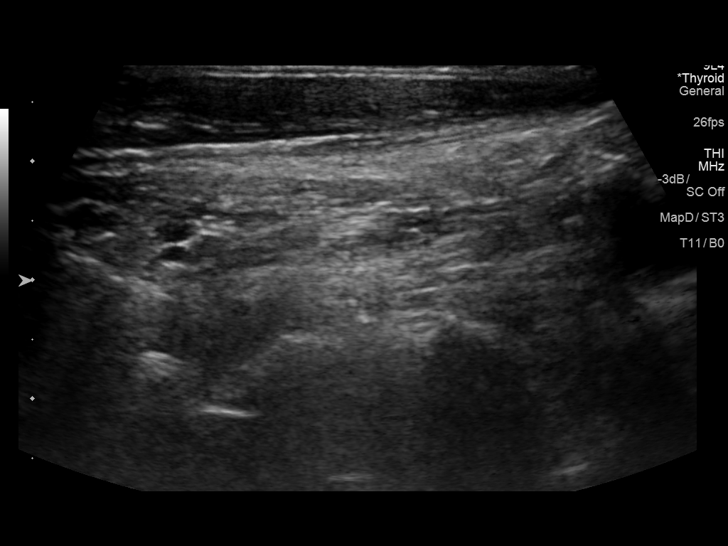

[13 of 25 positions shown; findings below may reference images not displayed]

FINDINGS: Parenchymal Echotexture: Mildly heterogenous

Isthmus: 0.3 cm

Right lobe: 5.5 x 1.2 x 2.1 cm

Left lobe: 5.0 x 1.7 x 2.3 cm

_________________________________________________________

Estimated total number of nodules >/= 1 cm: 4

Number of spongiform nodules >/=  2 cm not described below (TR1): 0

Number of mixed cystic and solid nodules >/= 1.5 cm not described
below (TR2): 0

_________________________________________________________

Nodule # 1:

Location: Left; Mid

Maximum size: 1.4 cm; Other 2 dimensions: 0.6 x 0.9 cm

Composition: solid/almost completely solid (2)

Echogenicity: hypoechoic (2)

Shape: not taller-than-wide (0)

Margins: smooth (0)

Echogenic foci: none (0)

ACR TI-RADS total points: 4.

ACR TI-RADS risk category: TR4 (4-6 points).

ACR TI-RADS recommendations:

*Given size (>/= 1 - 1.4 cm) and appearance, a follow-up ultrasound
in 1 year should be considered based on TI-RADS criteria. This
nodule is somewhat exophytic and may correspond to the palpable
abnormality.

_________________________________________________________

1.0 cm simple cyst in the right lobe has a benign appearance.

0.6 cm hypoechoic right mid nodule has a benign appearance.

1.2 cm simple cyst in the upper pole of the left lobe has a benign
appearance.

1.0 cm simple cyst in the lower pole of the left lobe has a benign
appearance.
IMPRESSION: There are multiple predominately small nodules and cysts which have
a benign appearance. Nodule 1 in the left lobe meets criteria for
annual follow-up.

The above is in keeping with the ACR TI-RADS recommendations - [HOSPITAL] 0242;[DATE].

## 2021-08-11 DIAGNOSIS — Z6822 Body mass index (BMI) 22.0-22.9, adult: Secondary | ICD-10-CM | POA: Diagnosis not present

## 2021-08-11 DIAGNOSIS — L409 Psoriasis, unspecified: Secondary | ICD-10-CM | POA: Diagnosis not present

## 2021-08-11 DIAGNOSIS — M2550 Pain in unspecified joint: Secondary | ICD-10-CM | POA: Diagnosis not present

## 2021-08-11 DIAGNOSIS — L4059 Other psoriatic arthropathy: Secondary | ICD-10-CM | POA: Diagnosis not present

## 2021-08-11 DIAGNOSIS — R5383 Other fatigue: Secondary | ICD-10-CM | POA: Diagnosis not present

## 2021-08-11 DIAGNOSIS — M25512 Pain in left shoulder: Secondary | ICD-10-CM | POA: Diagnosis not present

## 2021-09-02 ENCOUNTER — Other Ambulatory Visit (HOSPITAL_COMMUNITY): Payer: Self-pay

## 2021-09-03 ENCOUNTER — Other Ambulatory Visit: Payer: Self-pay

## 2021-09-03 ENCOUNTER — Ambulatory Visit: Payer: Self-pay | Attending: *Deleted | Admitting: Pharmacist

## 2021-09-03 ENCOUNTER — Other Ambulatory Visit (HOSPITAL_COMMUNITY): Payer: Self-pay

## 2021-09-03 DIAGNOSIS — Z79899 Other long term (current) drug therapy: Secondary | ICD-10-CM

## 2021-09-03 MED ORDER — COSENTYX SENSOREADY PEN 150 MG/ML ~~LOC~~ SOAJ: SUBCUTANEOUS | 1 refills | Status: DC

## 2021-09-03 MED ORDER — COSENTYX SENSOREADY PEN 150 MG/ML ~~LOC~~ SOAJ: SUBCUTANEOUS | 3 refills | Status: DC

## 2021-09-03 MED ORDER — COSENTYX SENSOREADY PEN 150 MG/ML ~~LOC~~ SOAJ
SUBCUTANEOUS | 1 refills | Status: DC
  Filled 2021-09-03: qty 5, 30d supply, fill #0
  Filled 2021-09-05 (×2): qty 4, 28d supply, fill #0
  Filled 2021-09-05: qty 5, 30d supply, fill #0
  Filled 2021-10-01 – 2021-11-27 (×2): qty 4, 28d supply, fill #1

## 2021-09-03 MED ORDER — COSENTYX SENSOREADY PEN 150 MG/ML ~~LOC~~ SOAJ
SUBCUTANEOUS | 3 refills | Status: DC
  Filled 2021-09-03: qty 1, fill #0
  Filled 2021-10-02: qty 1, 28d supply, fill #0
  Filled 2021-10-28: qty 1, 28d supply, fill #1
  Filled 2021-11-27: qty 1, 28d supply, fill #2

## 2021-09-03 NOTE — Progress Notes (Signed)
   S: Patient presents today for review of their specialty medication.   Patient is currently taking Cosentyx (secukinumab) for psoriasis. Patient is managed by [facility] for this.   Dosing: Psoriatic arthritis: SubQ: With a loading dose: 150 mg at weeks 0, 1, 2, 3, and 4 followed by 150 mg every 4 weeks; consider an increase to 300 mg every 4 weeks in patients who continue to have active psoriatic arthritis. Without a loading dose: 150 mg every 4 weeks; consider an increase to 300 mg every 4 weeks in patients who continue to have active psoriatic arthritis. Coexistent moderate to severe plaque psoriasis: 300 mg once weekly at weeks 0, 1, 2, 3, and 4 followed by 300 mg every 4 weeks. Some patients may only require 150 mg per dose.  Adherence: has not yet started  Efficacy: has not yet started  Current adverse effects: S/sx of infection: none GI upset: none Headache: none S/sx of hypersensitivity: none  O: Lab Results  Component Value Date   WBC 12.5 (H) 04/29/2017   HGB 11.9 (L) 04/29/2017   HCT 34.2 (L) 04/29/2017   MCV 89.3 04/29/2017   PLT 233 04/29/2017     Chemistry      Component Value Date/Time   NA 135 04/21/2017 1345   K 4.5 04/21/2017 1345   CL 101 04/21/2017 1345   CO2 29 04/21/2017 1345   BUN 7 04/21/2017 1345   CREATININE 0.64 04/21/2017 1345      Component Value Date/Time   CALCIUM 9.9 04/21/2017 1345     A/P: 1. Medication review: Patient is about to start Cosentyx for psoriatic arthritis. Reviewed the medication with the patient, including the following: Cosentyx is a monoclonal antibody used in the treatment of ankylosing spondylitis, psoriasis, and psoriatic arthritis. The injection is subq and the medication should be allowed to reach room temp prior to injecting. Injection sites should be rotated. Possible adverse effects include headaches, GI upset, increased risk of infection and hypersensitivity reactions. No recommendations for any changes at  this time.  Benard Halsted, PharmD, [facility], [place] [phone]

## 2021-09-04 ENCOUNTER — Other Ambulatory Visit (HOSPITAL_COMMUNITY): Payer: Self-pay

## 2021-09-05 ENCOUNTER — Other Ambulatory Visit (HOSPITAL_COMMUNITY): Payer: Self-pay

## 2021-09-08 ENCOUNTER — Other Ambulatory Visit (HOSPITAL_COMMUNITY): Payer: Self-pay

## 2021-09-30 ENCOUNTER — Other Ambulatory Visit (HOSPITAL_COMMUNITY): Payer: Self-pay

## 2021-10-01 ENCOUNTER — Other Ambulatory Visit (HOSPITAL_COMMUNITY): Payer: Self-pay

## 2021-10-02 ENCOUNTER — Other Ambulatory Visit (HOSPITAL_COMMUNITY): Payer: Self-pay

## 2021-10-28 ENCOUNTER — Other Ambulatory Visit (HOSPITAL_COMMUNITY): Payer: Self-pay

## 2021-10-29 ENCOUNTER — Other Ambulatory Visit (HOSPITAL_COMMUNITY): Payer: Self-pay

## 2021-10-29 MED ORDER — GABAPENTIN 400 MG PO CAPS
400.0000 mg | ORAL_CAPSULE | Freq: Every day | ORAL | 2 refills | Status: DC
  Filled 2021-10-29: qty 30, 30d supply, fill #0

## 2021-10-29 MED ORDER — GABAPENTIN 400 MG PO CAPS
400.0000 mg | ORAL_CAPSULE | Freq: Every day | ORAL | 2 refills | Status: DC
  Filled 2021-10-29: qty 30, 30d supply, fill #0
  Filled 2021-11-27: qty 30, 30d supply, fill #1
  Filled 2021-12-31: qty 30, 30d supply, fill #2

## 2021-10-29 MED ORDER — DIAZEPAM 5 MG PO TABS
5.0000 mg | ORAL_TABLET | Freq: Two times a day (BID) | ORAL | 1 refills | Status: AC | PRN
  Filled 2021-10-29: qty 60, 30d supply, fill #0

## 2021-10-30 ENCOUNTER — Other Ambulatory Visit (HOSPITAL_COMMUNITY): Payer: Self-pay

## 2021-11-27 ENCOUNTER — Other Ambulatory Visit (HOSPITAL_COMMUNITY): Payer: Self-pay

## 2021-12-11 ENCOUNTER — Other Ambulatory Visit: Payer: Self-pay | Admitting: Pharmacist

## 2021-12-11 ENCOUNTER — Other Ambulatory Visit (HOSPITAL_COMMUNITY): Payer: Self-pay

## 2021-12-11 DIAGNOSIS — M25512 Pain in left shoulder: Secondary | ICD-10-CM | POA: Diagnosis not present

## 2021-12-11 DIAGNOSIS — L4059 Other psoriatic arthropathy: Secondary | ICD-10-CM | POA: Diagnosis not present

## 2021-12-11 DIAGNOSIS — M2550 Pain in unspecified joint: Secondary | ICD-10-CM | POA: Diagnosis not present

## 2021-12-11 DIAGNOSIS — L409 Psoriasis, unspecified: Secondary | ICD-10-CM | POA: Diagnosis not present

## 2021-12-11 DIAGNOSIS — Z6821 Body mass index (BMI) 21.0-21.9, adult: Secondary | ICD-10-CM | POA: Diagnosis not present

## 2021-12-11 MED ORDER — COSENTYX (300 MG DOSE) 150 MG/ML ~~LOC~~ SOSY: PREFILLED_SYRINGE | SUBCUTANEOUS | 2 refills | Status: DC

## 2021-12-11 MED ORDER — COSENTYX (300 MG DOSE) 150 MG/ML ~~LOC~~ SOSY
PREFILLED_SYRINGE | SUBCUTANEOUS | 2 refills | Status: DC
  Filled 2021-12-11: qty 2, fill #0

## 2021-12-17 ENCOUNTER — Other Ambulatory Visit (HOSPITAL_COMMUNITY): Payer: Self-pay

## 2021-12-18 ENCOUNTER — Other Ambulatory Visit (HOSPITAL_COMMUNITY): Payer: Self-pay

## 2021-12-23 ENCOUNTER — Other Ambulatory Visit (HOSPITAL_COMMUNITY): Payer: Self-pay

## 2021-12-24 ENCOUNTER — Other Ambulatory Visit (HOSPITAL_COMMUNITY): Payer: Self-pay

## 2021-12-24 DIAGNOSIS — G43009 Migraine without aura, not intractable, without status migrainosus: Secondary | ICD-10-CM | POA: Diagnosis not present

## 2021-12-24 DIAGNOSIS — L4050 Arthropathic psoriasis, unspecified: Secondary | ICD-10-CM | POA: Diagnosis not present

## 2021-12-24 DIAGNOSIS — M26601 Right temporomandibular joint disorder, unspecified: Secondary | ICD-10-CM | POA: Diagnosis not present

## 2021-12-24 MED ORDER — TRAMADOL HCL 50 MG PO TABS
50.0000 mg | ORAL_TABLET | Freq: Three times a day (TID) | ORAL | 0 refills | Status: DC | PRN
  Filled 2021-12-24: qty 90, 30d supply, fill #0

## 2021-12-24 MED ORDER — DIAZEPAM 5 MG PO TABS
5.0000 mg | ORAL_TABLET | Freq: Two times a day (BID) | ORAL | 2 refills | Status: DC
  Filled 2021-12-24: qty 60, 30d supply, fill #0
  Filled 2022-02-23: qty 60, 30d supply, fill #1
  Filled 2022-04-21: qty 60, 30d supply, fill #2

## 2021-12-24 MED ORDER — PROPRANOLOL HCL 60 MG PO TABS
120.0000 mg | ORAL_TABLET | Freq: Every day | ORAL | 6 refills | Status: DC
  Filled 2021-12-24: qty 60, 30d supply, fill #0
  Filled 2022-01-26: qty 60, 30d supply, fill #1
  Filled 2022-02-23: qty 60, 30d supply, fill #2
  Filled 2022-03-24: qty 60, 30d supply, fill #3
  Filled 2022-04-21: qty 60, 30d supply, fill #4
  Filled 2022-05-27: qty 60, 30d supply, fill #5
  Filled 2022-06-21: qty 60, 30d supply, fill #6

## 2021-12-25 ENCOUNTER — Other Ambulatory Visit (HOSPITAL_COMMUNITY): Payer: Self-pay

## 2021-12-26 ENCOUNTER — Other Ambulatory Visit (HOSPITAL_COMMUNITY): Payer: Self-pay

## 2021-12-31 ENCOUNTER — Other Ambulatory Visit: Payer: Self-pay | Admitting: Pharmacist

## 2021-12-31 ENCOUNTER — Other Ambulatory Visit (HOSPITAL_COMMUNITY): Payer: Self-pay

## 2021-12-31 MED ORDER — COSENTYX SENSOREADY (300 MG) 150 MG/ML ~~LOC~~ SOAJ
SUBCUTANEOUS | 2 refills | Status: DC
  Filled 2021-12-31: qty 2, 30d supply, fill #0
  Filled 2022-01-06: qty 2, 28d supply, fill #0
  Filled 2022-01-26: qty 2, 28d supply, fill #1
  Filled 2022-03-02 (×2): qty 2, 28d supply, fill #2

## 2021-12-31 MED ORDER — HYDROCORTISONE ACETATE 25 MG RE SUPP
RECTAL | 2 refills | Status: AC
  Filled 2021-12-31: qty 18, 6d supply, fill #0
  Filled 2022-06-29: qty 18, 6d supply, fill #1

## 2021-12-31 MED ORDER — COSENTYX SENSOREADY (300 MG) 150 MG/ML ~~LOC~~ SOAJ: SUBCUTANEOUS | 2 refills | Status: DC

## 2022-01-01 ENCOUNTER — Other Ambulatory Visit (HOSPITAL_COMMUNITY): Payer: Self-pay

## 2022-01-02 ENCOUNTER — Other Ambulatory Visit (HOSPITAL_COMMUNITY): Payer: Self-pay

## 2022-01-03 ENCOUNTER — Other Ambulatory Visit (HOSPITAL_COMMUNITY): Payer: Self-pay

## 2022-01-06 ENCOUNTER — Other Ambulatory Visit (HOSPITAL_COMMUNITY): Payer: Self-pay

## 2022-01-26 ENCOUNTER — Other Ambulatory Visit (HOSPITAL_COMMUNITY): Payer: Self-pay

## 2022-01-26 MED ORDER — GABAPENTIN 400 MG PO CAPS
ORAL_CAPSULE | ORAL | 2 refills | Status: DC
  Filled 2022-01-26: qty 30, 30d supply, fill #0
  Filled 2022-02-23: qty 30, 30d supply, fill #1
  Filled 2022-03-24: qty 30, 30d supply, fill #2

## 2022-01-28 ENCOUNTER — Other Ambulatory Visit (HOSPITAL_COMMUNITY): Payer: Self-pay

## 2022-02-12 ENCOUNTER — Other Ambulatory Visit (HOSPITAL_COMMUNITY): Payer: Self-pay

## 2022-02-23 ENCOUNTER — Other Ambulatory Visit (HOSPITAL_COMMUNITY): Payer: Self-pay

## 2022-03-02 ENCOUNTER — Other Ambulatory Visit (HOSPITAL_COMMUNITY): Payer: Self-pay

## 2022-03-04 ENCOUNTER — Other Ambulatory Visit (HOSPITAL_COMMUNITY): Payer: Self-pay

## 2022-03-17 DIAGNOSIS — L819 Disorder of pigmentation, unspecified: Secondary | ICD-10-CM | POA: Diagnosis not present

## 2022-03-17 DIAGNOSIS — L409 Psoriasis, unspecified: Secondary | ICD-10-CM | POA: Diagnosis not present

## 2022-03-17 DIAGNOSIS — M2550 Pain in unspecified joint: Secondary | ICD-10-CM | POA: Diagnosis not present

## 2022-03-17 DIAGNOSIS — L4059 Other psoriatic arthropathy: Secondary | ICD-10-CM | POA: Diagnosis not present

## 2022-03-17 DIAGNOSIS — Z6820 Body mass index (BMI) 20.0-20.9, adult: Secondary | ICD-10-CM | POA: Diagnosis not present

## 2022-03-24 ENCOUNTER — Other Ambulatory Visit (HOSPITAL_COMMUNITY): Payer: Self-pay

## 2022-03-25 ENCOUNTER — Other Ambulatory Visit (HOSPITAL_COMMUNITY): Payer: Self-pay

## 2022-03-26 ENCOUNTER — Other Ambulatory Visit (HOSPITAL_COMMUNITY): Payer: Self-pay

## 2022-03-26 ENCOUNTER — Other Ambulatory Visit: Payer: Self-pay | Admitting: Pharmacist

## 2022-03-26 MED ORDER — COSENTYX SENSOREADY (300 MG) 150 MG/ML ~~LOC~~ SOAJ: SUBCUTANEOUS | 2 refills | Status: DC

## 2022-03-30 DIAGNOSIS — B360 Pityriasis versicolor: Secondary | ICD-10-CM | POA: Diagnosis not present

## 2022-03-31 ENCOUNTER — Other Ambulatory Visit (HOSPITAL_COMMUNITY): Payer: Self-pay

## 2022-03-31 MED ORDER — FLUCONAZOLE 150 MG PO TABS
150.0000 mg | ORAL_TABLET | ORAL | 0 refills | Status: AC
  Filled 2022-03-31: qty 10, 20d supply, fill #0

## 2022-04-01 ENCOUNTER — Other Ambulatory Visit: Payer: Self-pay | Admitting: Pharmacist

## 2022-04-01 ENCOUNTER — Other Ambulatory Visit (HOSPITAL_COMMUNITY): Payer: Self-pay

## 2022-04-01 MED ORDER — COSENTYX SENSOREADY (300 MG) 150 MG/ML ~~LOC~~ SOAJ
SUBCUTANEOUS | 2 refills | Status: DC
  Filled 2022-04-01: qty 2, 28d supply, fill #0
  Filled 2022-04-21: qty 2, 28d supply, fill #1
  Filled 2022-05-29: qty 2, 28d supply, fill #2

## 2022-04-21 ENCOUNTER — Other Ambulatory Visit (HOSPITAL_COMMUNITY): Payer: Self-pay

## 2022-04-21 MED ORDER — GABAPENTIN 400 MG PO CAPS
ORAL_CAPSULE | ORAL | 2 refills | Status: DC
  Filled 2022-04-21: qty 30, 30d supply, fill #0
  Filled 2022-05-27: qty 30, 30d supply, fill #1
  Filled 2022-06-21: qty 30, 30d supply, fill #2

## 2022-04-22 ENCOUNTER — Other Ambulatory Visit (HOSPITAL_COMMUNITY): Payer: Self-pay

## 2022-05-27 ENCOUNTER — Other Ambulatory Visit (HOSPITAL_COMMUNITY): Payer: Self-pay

## 2022-05-29 ENCOUNTER — Other Ambulatory Visit (HOSPITAL_COMMUNITY): Payer: Self-pay

## 2022-06-02 ENCOUNTER — Other Ambulatory Visit (HOSPITAL_COMMUNITY): Payer: Self-pay

## 2022-06-22 ENCOUNTER — Other Ambulatory Visit (HOSPITAL_COMMUNITY): Payer: Self-pay

## 2022-06-23 ENCOUNTER — Other Ambulatory Visit (HOSPITAL_COMMUNITY): Payer: Self-pay

## 2022-06-23 ENCOUNTER — Other Ambulatory Visit: Payer: Self-pay | Admitting: Pharmacist

## 2022-06-23 MED ORDER — COSENTYX SENSOREADY (300 MG) 150 MG/ML ~~LOC~~ SOAJ: SUBCUTANEOUS | 2 refills | Status: DC

## 2022-06-23 MED ORDER — COSENTYX SENSOREADY (300 MG) 150 MG/ML ~~LOC~~ SOAJ
SUBCUTANEOUS | 2 refills | Status: DC
  Filled 2022-06-23: qty 2, 28d supply, fill #0
  Filled 2022-07-20: qty 2, 28d supply, fill #1

## 2022-06-25 DIAGNOSIS — K219 Gastro-esophageal reflux disease without esophagitis: Secondary | ICD-10-CM | POA: Diagnosis not present

## 2022-06-25 DIAGNOSIS — M26601 Right temporomandibular joint disorder, unspecified: Secondary | ICD-10-CM | POA: Diagnosis not present

## 2022-06-25 DIAGNOSIS — G43009 Migraine without aura, not intractable, without status migrainosus: Secondary | ICD-10-CM | POA: Diagnosis not present

## 2022-06-25 DIAGNOSIS — L4050 Arthropathic psoriasis, unspecified: Secondary | ICD-10-CM | POA: Diagnosis not present

## 2022-06-26 ENCOUNTER — Other Ambulatory Visit (HOSPITAL_COMMUNITY): Payer: Self-pay

## 2022-06-29 ENCOUNTER — Other Ambulatory Visit (HOSPITAL_COMMUNITY): Payer: Self-pay

## 2022-06-29 MED ORDER — DIAZEPAM 5 MG PO TABS
ORAL_TABLET | ORAL | 2 refills | Status: DC
  Filled 2022-06-29: qty 60, 30d supply, fill #0
  Filled 2022-08-24: qty 60, 30d supply, fill #1
  Filled 2022-11-03: qty 60, 30d supply, fill #2

## 2022-06-29 MED ORDER — BUTALBITAL-ACETAMINOPHEN 50-325 MG PO TABS
ORAL_TABLET | ORAL | 3 refills | Status: AC
  Filled 2022-06-29: qty 240, 40d supply, fill #0
  Filled 2022-10-20: qty 240, 40d supply, fill #1
  Filled 2022-12-08 – 2022-12-11 (×7): qty 240, 40d supply, fill #2

## 2022-06-29 MED ORDER — TRAMADOL HCL 50 MG PO TABS
ORAL_TABLET | ORAL | 0 refills | Status: DC
  Filled 2022-06-29: qty 90, 30d supply, fill #0

## 2022-06-29 MED ORDER — FAMOTIDINE 20 MG PO TABS
ORAL_TABLET | ORAL | 3 refills | Status: DC
  Filled 2022-06-29: qty 30, 30d supply, fill #0
  Filled 2022-07-19: qty 30, 30d supply, fill #1
  Filled 2022-08-24: qty 30, 30d supply, fill #2
  Filled 2022-09-24: qty 30, 30d supply, fill #3

## 2022-07-01 ENCOUNTER — Other Ambulatory Visit (HOSPITAL_COMMUNITY): Payer: Self-pay

## 2022-07-02 ENCOUNTER — Other Ambulatory Visit (HOSPITAL_COMMUNITY): Payer: Self-pay

## 2022-07-13 ENCOUNTER — Other Ambulatory Visit (HOSPITAL_COMMUNITY): Payer: Self-pay

## 2022-07-17 ENCOUNTER — Other Ambulatory Visit (HOSPITAL_COMMUNITY): Payer: Self-pay

## 2022-07-19 ENCOUNTER — Other Ambulatory Visit (HOSPITAL_COMMUNITY): Payer: Self-pay

## 2022-07-20 ENCOUNTER — Other Ambulatory Visit (HOSPITAL_COMMUNITY): Payer: Self-pay

## 2022-07-20 MED ORDER — GABAPENTIN 400 MG PO CAPS
ORAL_CAPSULE | ORAL | 2 refills | Status: DC
  Filled 2022-07-20: qty 30, 30d supply, fill #0
  Filled 2022-08-14: qty 30, 30d supply, fill #1
  Filled 2022-09-10: qty 30, 30d supply, fill #2

## 2022-07-20 MED ORDER — PROPRANOLOL HCL 60 MG PO TABS
ORAL_TABLET | ORAL | 6 refills | Status: DC
  Filled 2022-07-20: qty 60, 30d supply, fill #0
  Filled 2022-08-14: qty 60, 30d supply, fill #1
  Filled 2022-09-10: qty 60, 30d supply, fill #2
  Filled 2022-10-13: qty 60, 30d supply, fill #3
  Filled 2022-11-09: qty 60, 30d supply, fill #4
  Filled 2022-12-08 – 2022-12-10 (×2): qty 60, 30d supply, fill #5
  Filled 2023-01-08: qty 60, 30d supply, fill #6

## 2022-07-21 ENCOUNTER — Other Ambulatory Visit (HOSPITAL_COMMUNITY): Payer: Self-pay

## 2022-07-23 ENCOUNTER — Other Ambulatory Visit (HOSPITAL_COMMUNITY): Payer: Self-pay

## 2022-07-27 ENCOUNTER — Other Ambulatory Visit (HOSPITAL_COMMUNITY): Payer: Self-pay

## 2022-07-30 ENCOUNTER — Other Ambulatory Visit (HOSPITAL_COMMUNITY): Payer: Self-pay

## 2022-08-14 ENCOUNTER — Other Ambulatory Visit (HOSPITAL_COMMUNITY): Payer: Self-pay

## 2022-08-14 ENCOUNTER — Other Ambulatory Visit: Payer: Self-pay | Admitting: Pharmacist

## 2022-08-14 MED ORDER — COSENTYX SENSOREADY (300 MG) 150 MG/ML ~~LOC~~ SOAJ
SUBCUTANEOUS | 1 refills | Status: DC
  Filled 2022-08-14: qty 2, fill #0
  Filled 2022-08-17: qty 2, 28d supply, fill #0
  Filled 2022-09-14: qty 2, 28d supply, fill #1

## 2022-08-14 MED ORDER — COSENTYX SENSOREADY (300 MG) 150 MG/ML ~~LOC~~ SOAJ: SUBCUTANEOUS | 1 refills | Status: DC

## 2022-08-17 ENCOUNTER — Other Ambulatory Visit (HOSPITAL_COMMUNITY): Payer: Self-pay

## 2022-08-20 ENCOUNTER — Other Ambulatory Visit (HOSPITAL_COMMUNITY): Payer: Self-pay

## 2022-08-20 DIAGNOSIS — L4050 Arthropathic psoriasis, unspecified: Secondary | ICD-10-CM | POA: Diagnosis not present

## 2022-08-20 DIAGNOSIS — M26601 Right temporomandibular joint disorder, unspecified: Secondary | ICD-10-CM | POA: Diagnosis not present

## 2022-08-20 DIAGNOSIS — K219 Gastro-esophageal reflux disease without esophagitis: Secondary | ICD-10-CM | POA: Diagnosis not present

## 2022-08-20 DIAGNOSIS — J329 Chronic sinusitis, unspecified: Secondary | ICD-10-CM | POA: Diagnosis not present

## 2022-08-20 DIAGNOSIS — Z1231 Encounter for screening mammogram for malignant neoplasm of breast: Secondary | ICD-10-CM | POA: Diagnosis not present

## 2022-08-20 DIAGNOSIS — G43009 Migraine without aura, not intractable, without status migrainosus: Secondary | ICD-10-CM | POA: Diagnosis not present

## 2022-08-20 MED ORDER — AMOXICILLIN-POT CLAVULANATE 875-125 MG PO TABS
ORAL_TABLET | ORAL | 0 refills | Status: AC
  Filled 2022-08-20: qty 20, 10d supply, fill #0

## 2022-08-20 MED ORDER — FLUCONAZOLE 150 MG PO TABS
ORAL_TABLET | ORAL | 0 refills | Status: AC
  Filled 2022-08-20: qty 3, 6d supply, fill #0

## 2022-08-21 ENCOUNTER — Other Ambulatory Visit (HOSPITAL_COMMUNITY): Payer: Self-pay

## 2022-08-24 ENCOUNTER — Other Ambulatory Visit (HOSPITAL_COMMUNITY): Payer: Self-pay

## 2022-08-26 ENCOUNTER — Other Ambulatory Visit (HOSPITAL_COMMUNITY): Payer: Self-pay

## 2022-09-10 ENCOUNTER — Ambulatory Visit: Payer: Self-pay | Attending: *Deleted | Admitting: Pharmacist

## 2022-09-10 ENCOUNTER — Other Ambulatory Visit (HOSPITAL_COMMUNITY): Payer: Self-pay

## 2022-09-10 DIAGNOSIS — Z79899 Other long term (current) drug therapy: Secondary | ICD-10-CM

## 2022-09-10 NOTE — Progress Notes (Signed)
   S: Patient presents today for review of their specialty medication.   Patient is currently taking Cosentyx (secukinumab) for psoriasis. Patient is managed by Leafy Kindle for this.   Dosing: Psoriatic arthritis: SubQ: With a loading dose: 150 mg at weeks 0, 1, 2, 3, and 4 followed by 150 mg every 4 weeks; consider an increase to 300 mg every 4 weeks in patients who continue to have active psoriatic arthritis. Without a loading dose: 150 mg every 4 weeks; consider an increase to 300 mg every 4 weeks in patients who continue to have active psoriatic arthritis. Coexistent moderate to severe plaque psoriasis: 300 mg once weekly at weeks 0, 1, 2, 3, and 4 followed by 300 mg every 4 weeks. Some patients may only require 150 mg per dose.  Adherence: confirms  Efficacy: admits that she is having more flares recently. She is unsure the cause but plans to discuss this with Sharyn Lull at her upcoming visit.   Current adverse effects: S/sx of infection: recently recovered from an URI. Additionally, she has been seen by dermatology and was diagnosed with a skin mycotic infection.  GI upset: none Headache: none S/sx of hypersensitivity: none  O: Lab Results  Component Value Date   WBC 12.5 (H) 04/29/2017   HGB 11.9 (L) 04/29/2017   HCT 34.2 (L) 04/29/2017   MCV 89.3 04/29/2017   PLT 233 04/29/2017     Chemistry      Component Value Date/Time   NA 135 04/21/2017 1345   K 4.5 04/21/2017 1345   CL 101 04/21/2017 1345   CO2 29 04/21/2017 1345   BUN 7 04/21/2017 1345   CREATININE 0.64 04/21/2017 1345      Component Value Date/Time   CALCIUM 9.9 04/21/2017 1345     A/P: 1. Medication review: Patient is taking Cosentyx for psoriatic arthritis. Reviewed the medication with the patient, including the following: Cosentyx is a monoclonal antibody used in the treatment of ankylosing spondylitis, psoriasis, and psoriatic arthritis. The injection is subq and the medication should be allowed to  reach room temp prior to injecting. Injection sites should be rotated. Possible adverse effects include headaches, GI upset, increased risk of infection and hypersensitivity reactions. Recommend discussion of alternative treatment options given potential lack of efficacy with current treatment.   Benard Halsted, PharmD, Para [place], [place] [phone]

## 2022-09-11 ENCOUNTER — Other Ambulatory Visit (HOSPITAL_COMMUNITY): Payer: Self-pay

## 2022-09-14 ENCOUNTER — Other Ambulatory Visit (HOSPITAL_COMMUNITY): Payer: Self-pay

## 2022-09-16 ENCOUNTER — Other Ambulatory Visit (HOSPITAL_COMMUNITY): Payer: Self-pay

## 2022-09-17 ENCOUNTER — Other Ambulatory Visit (HOSPITAL_COMMUNITY): Payer: Self-pay

## 2022-09-17 DIAGNOSIS — L4059 Other psoriatic arthropathy: Secondary | ICD-10-CM | POA: Diagnosis not present

## 2022-09-17 DIAGNOSIS — R252 Cramp and spasm: Secondary | ICD-10-CM | POA: Diagnosis not present

## 2022-09-17 DIAGNOSIS — M2550 Pain in unspecified joint: Secondary | ICD-10-CM | POA: Diagnosis not present

## 2022-09-17 DIAGNOSIS — L409 Psoriasis, unspecified: Secondary | ICD-10-CM | POA: Diagnosis not present

## 2022-09-17 DIAGNOSIS — R5382 Chronic fatigue, unspecified: Secondary | ICD-10-CM | POA: Diagnosis not present

## 2022-09-17 DIAGNOSIS — L819 Disorder of pigmentation, unspecified: Secondary | ICD-10-CM | POA: Diagnosis not present

## 2022-09-17 DIAGNOSIS — Z681 Body mass index (BMI) 19 or less, adult: Secondary | ICD-10-CM | POA: Diagnosis not present

## 2022-09-17 MED ORDER — GABAPENTIN 400 MG PO CAPS
400.0000 mg | ORAL_CAPSULE | Freq: Every day | ORAL | 4 refills | Status: DC
  Filled 2022-10-13: qty 30, 30d supply, fill #0
  Filled 2022-11-09: qty 30, 30d supply, fill #1
  Filled 2022-12-08 – 2022-12-10 (×2): qty 30, 30d supply, fill #2
  Filled 2023-01-08: qty 30, 30d supply, fill #3
  Filled 2023-02-04: qty 30, 30d supply, fill #4

## 2022-09-21 ENCOUNTER — Other Ambulatory Visit (HOSPITAL_COMMUNITY): Payer: Self-pay

## 2022-09-24 ENCOUNTER — Other Ambulatory Visit (HOSPITAL_COMMUNITY): Payer: Self-pay

## 2022-09-25 DIAGNOSIS — R7611 Nonspecific reaction to tuberculin skin test without active tuberculosis: Secondary | ICD-10-CM | POA: Diagnosis not present

## 2022-10-05 ENCOUNTER — Other Ambulatory Visit (HOSPITAL_COMMUNITY): Payer: Self-pay | Admitting: Internal Medicine

## 2022-10-05 DIAGNOSIS — Z1231 Encounter for screening mammogram for malignant neoplasm of breast: Secondary | ICD-10-CM

## 2022-10-12 ENCOUNTER — Other Ambulatory Visit: Payer: Self-pay | Admitting: Pharmacist

## 2022-10-12 ENCOUNTER — Other Ambulatory Visit (HOSPITAL_COMMUNITY): Payer: Self-pay

## 2022-10-12 MED ORDER — COSENTYX (300 MG DOSE) 150 MG/ML ~~LOC~~ SOSY
PREFILLED_SYRINGE | SUBCUTANEOUS | 2 refills | Status: DC
  Filled 2022-10-12: qty 2, fill #0
  Filled 2022-11-05: qty 2, 28d supply, fill #0

## 2022-10-12 MED ORDER — COSENTYX (300 MG DOSE) 150 MG/ML ~~LOC~~ SOSY: PREFILLED_SYRINGE | SUBCUTANEOUS | 2 refills | Status: DC

## 2022-10-13 ENCOUNTER — Other Ambulatory Visit (HOSPITAL_COMMUNITY): Payer: Self-pay

## 2022-10-13 MED ORDER — FAMOTIDINE 20 MG PO TABS
20.0000 mg | ORAL_TABLET | Freq: Every evening | ORAL | 3 refills | Status: DC | PRN
  Filled 2022-10-13 – 2022-10-15 (×2): qty 30, 30d supply, fill #0
  Filled 2022-11-17: qty 30, 30d supply, fill #1
  Filled 2022-12-08 – 2022-12-14 (×2): qty 30, 30d supply, fill #2
  Filled 2023-01-08: qty 30, 30d supply, fill #3

## 2022-10-14 ENCOUNTER — Other Ambulatory Visit (HOSPITAL_COMMUNITY): Payer: Self-pay

## 2022-10-15 ENCOUNTER — Other Ambulatory Visit (HOSPITAL_COMMUNITY): Payer: Self-pay

## 2022-10-16 ENCOUNTER — Other Ambulatory Visit (HOSPITAL_COMMUNITY): Payer: Self-pay

## 2022-10-19 ENCOUNTER — Other Ambulatory Visit (HOSPITAL_COMMUNITY): Payer: Self-pay

## 2022-10-20 ENCOUNTER — Other Ambulatory Visit (HOSPITAL_COMMUNITY): Payer: Self-pay

## 2022-10-21 ENCOUNTER — Other Ambulatory Visit (HOSPITAL_COMMUNITY): Payer: Self-pay

## 2022-10-21 ENCOUNTER — Encounter (HOSPITAL_COMMUNITY): Payer: Self-pay

## 2022-10-21 ENCOUNTER — Ambulatory Visit (HOSPITAL_COMMUNITY)
Admission: RE | Admit: 2022-10-21 | Discharge: 2022-10-21 | Disposition: A | Discharge status: Home / Self Care | Payer: 59 | Source: Ambulatory Visit | Attending: Internal Medicine | Admitting: Internal Medicine

## 2022-10-21 DIAGNOSIS — Z1231 Encounter for screening mammogram for malignant neoplasm of breast: Secondary | ICD-10-CM | POA: Insufficient documentation

## 2022-10-23 ENCOUNTER — Other Ambulatory Visit (HOSPITAL_COMMUNITY): Payer: Self-pay | Admitting: Internal Medicine

## 2022-10-28 ENCOUNTER — Other Ambulatory Visit (HOSPITAL_COMMUNITY): Payer: Self-pay | Admitting: Internal Medicine

## 2022-10-28 DIAGNOSIS — R928 Other abnormal and inconclusive findings on diagnostic imaging of breast: Secondary | ICD-10-CM

## 2022-11-03 ENCOUNTER — Other Ambulatory Visit (HOSPITAL_COMMUNITY): Payer: Self-pay

## 2022-11-05 ENCOUNTER — Other Ambulatory Visit (HOSPITAL_COMMUNITY): Payer: Self-pay

## 2022-11-06 ENCOUNTER — Other Ambulatory Visit (HOSPITAL_COMMUNITY): Payer: Self-pay

## 2022-11-09 ENCOUNTER — Other Ambulatory Visit (HOSPITAL_COMMUNITY): Payer: Self-pay

## 2022-11-10 ENCOUNTER — Other Ambulatory Visit (HOSPITAL_COMMUNITY): Payer: Self-pay

## 2022-11-11 ENCOUNTER — Other Ambulatory Visit (HOSPITAL_COMMUNITY): Payer: Self-pay

## 2022-11-12 ENCOUNTER — Other Ambulatory Visit (HOSPITAL_COMMUNITY): Payer: Self-pay

## 2022-11-12 ENCOUNTER — Other Ambulatory Visit: Payer: Self-pay | Admitting: Pharmacist

## 2022-11-12 MED ORDER — COSENTYX SENSOREADY (300 MG) 150 MG/ML ~~LOC~~ SOAJ: SUBCUTANEOUS | 4 refills | Status: DC

## 2022-11-12 MED ORDER — COSENTYX SENSOREADY (300 MG) 150 MG/ML ~~LOC~~ SOAJ
SUBCUTANEOUS | 4 refills | Status: DC
  Filled 2022-11-12: qty 2, 28d supply, fill #0
  Filled 2022-12-01: qty 2, 28d supply, fill #1
  Filled 2022-12-30: qty 2, 28d supply, fill #2
  Filled 2023-02-01: qty 2, 28d supply, fill #3
  Filled 2023-02-26: qty 2, 28d supply, fill #4

## 2022-11-17 ENCOUNTER — Other Ambulatory Visit (HOSPITAL_COMMUNITY): Payer: Self-pay

## 2022-11-24 ENCOUNTER — Ambulatory Visit (HOSPITAL_COMMUNITY)
Admission: RE | Admit: 2022-11-24 | Discharge: 2022-11-24 | Disposition: A | Discharge status: Home / Self Care | Payer: 59 | Source: Ambulatory Visit | Attending: Internal Medicine | Admitting: Internal Medicine

## 2022-11-24 DIAGNOSIS — R928 Other abnormal and inconclusive findings on diagnostic imaging of breast: Secondary | ICD-10-CM | POA: Diagnosis not present

## 2022-11-24 DIAGNOSIS — N6002 Solitary cyst of left breast: Secondary | ICD-10-CM | POA: Diagnosis not present

## 2022-11-24 DIAGNOSIS — N6489 Other specified disorders of breast: Secondary | ICD-10-CM | POA: Diagnosis not present

## 2022-11-26 ENCOUNTER — Other Ambulatory Visit (HOSPITAL_COMMUNITY): Payer: Self-pay

## 2022-11-27 ENCOUNTER — Other Ambulatory Visit (HOSPITAL_COMMUNITY): Payer: Self-pay

## 2022-12-01 ENCOUNTER — Other Ambulatory Visit (HOSPITAL_COMMUNITY): Payer: Self-pay

## 2022-12-03 ENCOUNTER — Other Ambulatory Visit (HOSPITAL_COMMUNITY): Payer: Self-pay

## 2022-12-08 ENCOUNTER — Other Ambulatory Visit (HOSPITAL_COMMUNITY): Payer: Self-pay

## 2022-12-09 ENCOUNTER — Other Ambulatory Visit (HOSPITAL_COMMUNITY): Payer: Self-pay

## 2022-12-10 ENCOUNTER — Other Ambulatory Visit (HOSPITAL_COMMUNITY): Payer: Self-pay

## 2022-12-11 ENCOUNTER — Other Ambulatory Visit: Payer: Self-pay

## 2022-12-11 ENCOUNTER — Other Ambulatory Visit (HOSPITAL_COMMUNITY): Payer: Self-pay

## 2022-12-14 ENCOUNTER — Other Ambulatory Visit: Payer: Self-pay

## 2022-12-15 ENCOUNTER — Other Ambulatory Visit: Payer: Self-pay

## 2022-12-24 ENCOUNTER — Other Ambulatory Visit (HOSPITAL_COMMUNITY): Payer: Self-pay

## 2022-12-24 ENCOUNTER — Other Ambulatory Visit: Payer: Self-pay

## 2022-12-24 MED ORDER — DIAZEPAM 5 MG PO TABS
5.0000 mg | ORAL_TABLET | Freq: Two times a day (BID) | ORAL | 2 refills | Status: DC | PRN
  Filled 2022-12-24: qty 60, 30d supply, fill #0
  Filled 2023-02-27: qty 60, 30d supply, fill #1
  Filled 2023-04-15: qty 60, 30d supply, fill #2

## 2022-12-24 MED ORDER — TRAMADOL HCL 50 MG PO TABS
50.0000 mg | ORAL_TABLET | Freq: Three times a day (TID) | ORAL | 0 refills | Status: DC | PRN
  Filled 2022-12-24: qty 90, 30d supply, fill #0

## 2022-12-30 ENCOUNTER — Other Ambulatory Visit (HOSPITAL_COMMUNITY): Payer: Self-pay

## 2022-12-30 ENCOUNTER — Other Ambulatory Visit: Payer: Self-pay

## 2022-12-31 ENCOUNTER — Other Ambulatory Visit (HOSPITAL_COMMUNITY): Payer: Self-pay

## 2022-12-31 ENCOUNTER — Other Ambulatory Visit: Payer: Self-pay

## 2023-01-04 ENCOUNTER — Other Ambulatory Visit: Payer: Self-pay

## 2023-01-04 ENCOUNTER — Other Ambulatory Visit (HOSPITAL_COMMUNITY): Payer: Self-pay

## 2023-01-05 ENCOUNTER — Other Ambulatory Visit: Payer: Self-pay

## 2023-01-05 ENCOUNTER — Other Ambulatory Visit (HOSPITAL_COMMUNITY): Payer: Self-pay

## 2023-01-08 ENCOUNTER — Other Ambulatory Visit: Payer: Self-pay

## 2023-01-08 ENCOUNTER — Other Ambulatory Visit (HOSPITAL_COMMUNITY): Payer: Self-pay

## 2023-01-14 ENCOUNTER — Other Ambulatory Visit (HOSPITAL_COMMUNITY): Payer: Self-pay

## 2023-01-19 ENCOUNTER — Other Ambulatory Visit (HOSPITAL_COMMUNITY): Payer: Self-pay

## 2023-01-28 ENCOUNTER — Other Ambulatory Visit (HOSPITAL_COMMUNITY): Payer: Self-pay

## 2023-02-01 ENCOUNTER — Other Ambulatory Visit (HOSPITAL_COMMUNITY): Payer: Self-pay

## 2023-02-01 ENCOUNTER — Other Ambulatory Visit: Payer: Self-pay

## 2023-02-02 ENCOUNTER — Other Ambulatory Visit: Payer: Self-pay

## 2023-02-02 ENCOUNTER — Other Ambulatory Visit (HOSPITAL_COMMUNITY): Payer: Self-pay

## 2023-02-02 MED ORDER — FAMOTIDINE 20 MG PO TABS
20.0000 mg | ORAL_TABLET | Freq: Every evening | ORAL | 3 refills | Status: DC | PRN
  Filled 2023-02-02: qty 30, 30d supply, fill #0
  Filled 2023-03-11: qty 30, 30d supply, fill #1
  Filled 2023-04-15: qty 30, 30d supply, fill #2
  Filled 2023-05-13: qty 30, 30d supply, fill #3

## 2023-02-02 MED ORDER — PROPRANOLOL HCL 60 MG PO TABS
120.0000 mg | ORAL_TABLET | Freq: Every day | ORAL | 6 refills | Status: DC
  Filled 2023-02-02: qty 60, 30d supply, fill #0
  Filled 2023-03-11: qty 60, 30d supply, fill #1
  Filled 2023-04-15: qty 60, 30d supply, fill #2
  Filled 2023-05-13: qty 60, 30d supply, fill #3
  Filled 2023-06-10: qty 60, 30d supply, fill #4
  Filled 2023-07-12: qty 60, 30d supply, fill #5
  Filled 2023-08-06: qty 60, 30d supply, fill #6

## 2023-02-03 ENCOUNTER — Other Ambulatory Visit: Payer: Self-pay

## 2023-02-04 ENCOUNTER — Other Ambulatory Visit: Payer: Self-pay

## 2023-02-04 ENCOUNTER — Other Ambulatory Visit (HOSPITAL_COMMUNITY): Payer: Self-pay

## 2023-02-23 ENCOUNTER — Other Ambulatory Visit (HOSPITAL_COMMUNITY): Payer: Self-pay

## 2023-02-26 ENCOUNTER — Other Ambulatory Visit (HOSPITAL_COMMUNITY): Payer: Self-pay

## 2023-03-01 ENCOUNTER — Other Ambulatory Visit: Payer: Self-pay

## 2023-03-02 ENCOUNTER — Other Ambulatory Visit (HOSPITAL_COMMUNITY): Payer: Self-pay

## 2023-03-11 ENCOUNTER — Other Ambulatory Visit: Payer: Self-pay

## 2023-03-18 ENCOUNTER — Other Ambulatory Visit (HOSPITAL_COMMUNITY): Payer: Self-pay

## 2023-03-18 ENCOUNTER — Other Ambulatory Visit: Payer: Self-pay

## 2023-03-18 DIAGNOSIS — Z681 Body mass index (BMI) 19 or less, adult: Secondary | ICD-10-CM | POA: Diagnosis not present

## 2023-03-18 DIAGNOSIS — R5382 Chronic fatigue, unspecified: Secondary | ICD-10-CM | POA: Diagnosis not present

## 2023-03-18 DIAGNOSIS — L409 Psoriasis, unspecified: Secondary | ICD-10-CM | POA: Diagnosis not present

## 2023-03-18 DIAGNOSIS — R252 Cramp and spasm: Secondary | ICD-10-CM | POA: Diagnosis not present

## 2023-03-18 DIAGNOSIS — M2550 Pain in unspecified joint: Secondary | ICD-10-CM | POA: Diagnosis not present

## 2023-03-18 DIAGNOSIS — L4059 Other psoriatic arthropathy: Secondary | ICD-10-CM | POA: Diagnosis not present

## 2023-03-18 MED ORDER — GABAPENTIN 400 MG PO CAPS
400.0000 mg | ORAL_CAPSULE | Freq: Every day | ORAL | 6 refills | Status: DC
  Filled 2023-03-18: qty 30, 30d supply, fill #0
  Filled 2023-04-15: qty 30, 30d supply, fill #1
  Filled 2023-05-13: qty 30, 30d supply, fill #2
  Filled 2023-06-14: qty 30, 30d supply, fill #3
  Filled 2023-08-06: qty 30, 30d supply, fill #4
  Filled 2023-08-29 – 2023-08-31 (×2): qty 30, 30d supply, fill #5
  Filled 2023-09-30: qty 30, 30d supply, fill #6

## 2023-03-30 ENCOUNTER — Other Ambulatory Visit (HOSPITAL_COMMUNITY): Payer: Self-pay

## 2023-03-31 ENCOUNTER — Other Ambulatory Visit: Payer: Self-pay | Admitting: Pharmacist

## 2023-03-31 ENCOUNTER — Other Ambulatory Visit: Payer: Self-pay

## 2023-03-31 ENCOUNTER — Other Ambulatory Visit (HOSPITAL_COMMUNITY): Payer: Self-pay

## 2023-03-31 MED ORDER — COSENTYX (300 MG DOSE) 150 MG/ML ~~LOC~~ SOSY: PREFILLED_SYRINGE | SUBCUTANEOUS | 4 refills | Status: DC

## 2023-03-31 MED ORDER — COSENTYX (300 MG DOSE) 150 MG/ML ~~LOC~~ SOSY
PREFILLED_SYRINGE | SUBCUTANEOUS | 4 refills | Status: DC
  Filled 2023-03-31: qty 2, 28d supply, fill #0

## 2023-04-02 ENCOUNTER — Other Ambulatory Visit (HOSPITAL_COMMUNITY): Payer: Self-pay

## 2023-04-02 ENCOUNTER — Other Ambulatory Visit: Payer: Self-pay

## 2023-04-05 ENCOUNTER — Other Ambulatory Visit: Payer: Self-pay

## 2023-04-05 ENCOUNTER — Other Ambulatory Visit (HOSPITAL_COMMUNITY): Payer: Self-pay

## 2023-04-05 ENCOUNTER — Other Ambulatory Visit: Payer: Self-pay | Admitting: Pharmacist

## 2023-04-05 MED ORDER — COSENTYX SENSOREADY (300 MG) 150 MG/ML ~~LOC~~ SOAJ
SUBCUTANEOUS | 4 refills | Status: DC
  Filled 2023-04-05: qty 2, 28d supply, fill #0
  Filled 2023-04-27: qty 2, 28d supply, fill #1
  Filled 2023-05-26: qty 2, 28d supply, fill #2
  Filled 2023-06-28: qty 2, 28d supply, fill #3
  Filled 2023-08-05: qty 2, 28d supply, fill #4

## 2023-04-05 MED ORDER — COSENTYX SENSOREADY (300 MG) 150 MG/ML ~~LOC~~ SOAJ: SUBCUTANEOUS | 4 refills | Status: DC

## 2023-04-15 ENCOUNTER — Other Ambulatory Visit (HOSPITAL_COMMUNITY): Payer: Self-pay

## 2023-04-15 ENCOUNTER — Other Ambulatory Visit: Payer: Self-pay

## 2023-04-27 ENCOUNTER — Other Ambulatory Visit (HOSPITAL_COMMUNITY): Payer: Self-pay

## 2023-05-05 ENCOUNTER — Other Ambulatory Visit: Payer: Self-pay

## 2023-05-06 ENCOUNTER — Other Ambulatory Visit (HOSPITAL_COMMUNITY): Payer: Self-pay

## 2023-05-13 ENCOUNTER — Other Ambulatory Visit (HOSPITAL_COMMUNITY): Payer: Self-pay

## 2023-05-13 ENCOUNTER — Other Ambulatory Visit: Payer: Self-pay

## 2023-05-26 ENCOUNTER — Other Ambulatory Visit (HOSPITAL_COMMUNITY): Payer: Self-pay

## 2023-05-31 ENCOUNTER — Other Ambulatory Visit (HOSPITAL_COMMUNITY): Payer: Self-pay

## 2023-06-01 ENCOUNTER — Other Ambulatory Visit (HOSPITAL_COMMUNITY): Payer: Self-pay

## 2023-06-10 ENCOUNTER — Other Ambulatory Visit (HOSPITAL_COMMUNITY): Payer: Self-pay

## 2023-06-10 ENCOUNTER — Other Ambulatory Visit: Payer: Self-pay

## 2023-06-10 MED ORDER — FAMOTIDINE 20 MG PO TABS
20.0000 mg | ORAL_TABLET | Freq: Every evening | ORAL | 3 refills | Status: DC | PRN
  Filled 2023-06-10 (×2): qty 30, 30d supply, fill #0
  Filled 2023-07-12: qty 30, 30d supply, fill #1
  Filled 2023-08-06: qty 30, 30d supply, fill #2
  Filled 2023-08-29 – 2023-08-31 (×2): qty 30, 30d supply, fill #3

## 2023-06-14 ENCOUNTER — Other Ambulatory Visit (HOSPITAL_COMMUNITY): Payer: Self-pay

## 2023-06-14 ENCOUNTER — Other Ambulatory Visit: Payer: Self-pay

## 2023-06-15 ENCOUNTER — Other Ambulatory Visit (HOSPITAL_COMMUNITY): Payer: Self-pay

## 2023-06-15 ENCOUNTER — Other Ambulatory Visit: Payer: Self-pay

## 2023-06-15 MED ORDER — DIAZEPAM 5 MG PO TABS
5.0000 mg | ORAL_TABLET | Freq: Two times a day (BID) | ORAL | 2 refills | Status: DC | PRN
  Filled 2023-06-15: qty 60, 30d supply, fill #0
  Filled 2023-08-29: qty 60, 30d supply, fill #1
  Filled 2023-09-30: qty 60, 30d supply, fill #2

## 2023-06-16 DIAGNOSIS — B360 Pityriasis versicolor: Secondary | ICD-10-CM | POA: Diagnosis not present

## 2023-06-17 ENCOUNTER — Other Ambulatory Visit (HOSPITAL_COMMUNITY): Payer: Self-pay

## 2023-06-17 MED ORDER — FLUCONAZOLE 150 MG PO TABS
150.0000 mg | ORAL_TABLET | ORAL | 1 refills | Status: AC
  Filled 2023-06-17: qty 10, 20d supply, fill #0
  Filled 2024-01-29: qty 10, 20d supply, fill #1

## 2023-06-24 ENCOUNTER — Other Ambulatory Visit: Payer: Self-pay

## 2023-06-28 ENCOUNTER — Other Ambulatory Visit (HOSPITAL_COMMUNITY): Payer: Self-pay

## 2023-06-28 ENCOUNTER — Other Ambulatory Visit: Payer: Self-pay

## 2023-07-12 ENCOUNTER — Other Ambulatory Visit (HOSPITAL_COMMUNITY): Payer: Self-pay

## 2023-07-12 ENCOUNTER — Other Ambulatory Visit: Payer: Self-pay

## 2023-07-13 ENCOUNTER — Other Ambulatory Visit: Payer: Self-pay

## 2023-07-13 ENCOUNTER — Other Ambulatory Visit (HOSPITAL_COMMUNITY): Payer: Self-pay

## 2023-08-05 ENCOUNTER — Other Ambulatory Visit (HOSPITAL_COMMUNITY): Payer: Self-pay

## 2023-08-06 ENCOUNTER — Other Ambulatory Visit: Payer: Self-pay

## 2023-08-13 ENCOUNTER — Other Ambulatory Visit (HOSPITAL_COMMUNITY): Payer: Self-pay

## 2023-08-16 ENCOUNTER — Other Ambulatory Visit (HOSPITAL_COMMUNITY): Payer: Self-pay

## 2023-08-23 ENCOUNTER — Other Ambulatory Visit (HOSPITAL_COMMUNITY): Payer: Self-pay

## 2023-08-23 ENCOUNTER — Other Ambulatory Visit: Payer: Self-pay

## 2023-08-23 DIAGNOSIS — G43009 Migraine without aura, not intractable, without status migrainosus: Secondary | ICD-10-CM | POA: Diagnosis not present

## 2023-08-23 DIAGNOSIS — K219 Gastro-esophageal reflux disease without esophagitis: Secondary | ICD-10-CM | POA: Diagnosis not present

## 2023-08-23 DIAGNOSIS — Z1231 Encounter for screening mammogram for malignant neoplasm of breast: Secondary | ICD-10-CM | POA: Diagnosis not present

## 2023-08-23 DIAGNOSIS — L4050 Arthropathic psoriasis, unspecified: Secondary | ICD-10-CM | POA: Diagnosis not present

## 2023-08-23 DIAGNOSIS — M26601 Right temporomandibular joint disorder, unspecified: Secondary | ICD-10-CM | POA: Diagnosis not present

## 2023-08-23 MED ORDER — FAMOTIDINE 20 MG PO TABS
20.0000 mg | ORAL_TABLET | Freq: Every evening | ORAL | 3 refills | Status: DC | PRN
  Filled 2023-08-23 – 2023-12-25 (×2): qty 90, 90d supply, fill #0
  Filled 2024-05-04: qty 90, 90d supply, fill #1
  Filled 2024-07-28: qty 90, 90d supply, fill #2

## 2023-08-23 MED ORDER — BUTALBITAL-ACETAMINOPHEN 50-325 MG PO TABS
1.0000 | ORAL_TABLET | ORAL | 0 refills | Status: DC | PRN
  Filled 2023-08-23: qty 180, 30d supply, fill #0

## 2023-08-29 ENCOUNTER — Other Ambulatory Visit (HOSPITAL_COMMUNITY): Payer: Self-pay

## 2023-08-31 ENCOUNTER — Other Ambulatory Visit: Payer: Self-pay

## 2023-08-31 ENCOUNTER — Other Ambulatory Visit (HOSPITAL_COMMUNITY): Payer: Self-pay

## 2023-08-31 MED ORDER — PROPRANOLOL HCL 60 MG PO TABS
120.0000 mg | ORAL_TABLET | Freq: Every day | ORAL | 6 refills | Status: DC
  Filled 2023-08-31: qty 60, 30d supply, fill #0
  Filled 2023-09-30: qty 60, 30d supply, fill #1
  Filled 2023-10-29: qty 60, 30d supply, fill #2
  Filled 2023-11-28: qty 60, 30d supply, fill #3
  Filled 2023-12-25: qty 60, 30d supply, fill #4
  Filled 2024-01-29: qty 60, 30d supply, fill #5
  Filled 2024-02-26: qty 60, 30d supply, fill #6

## 2023-09-08 ENCOUNTER — Other Ambulatory Visit (HOSPITAL_COMMUNITY): Payer: Self-pay

## 2023-09-08 ENCOUNTER — Other Ambulatory Visit: Payer: Self-pay

## 2023-09-08 MED ORDER — COSENTYX SENSOREADY (300 MG) 150 MG/ML ~~LOC~~ SOAJ: SUBCUTANEOUS | 0 refills | Status: DC

## 2023-09-13 ENCOUNTER — Other Ambulatory Visit (HOSPITAL_COMMUNITY): Payer: Self-pay

## 2023-09-14 ENCOUNTER — Other Ambulatory Visit (HOSPITAL_COMMUNITY): Payer: Self-pay

## 2023-09-15 ENCOUNTER — Ambulatory Visit: Payer: Commercial Managed Care - PPO | Attending: Internal Medicine | Admitting: Pharmacist

## 2023-09-15 ENCOUNTER — Other Ambulatory Visit: Payer: Self-pay

## 2023-09-15 ENCOUNTER — Other Ambulatory Visit (HOSPITAL_COMMUNITY): Payer: Self-pay

## 2023-09-15 DIAGNOSIS — Z79899 Other long term (current) drug therapy: Secondary | ICD-10-CM

## 2023-09-15 MED ORDER — COSENTYX SENSOREADY (300 MG) 150 MG/ML ~~LOC~~ SOAJ
SUBCUTANEOUS | 0 refills | Status: DC
  Filled 2023-09-15: qty 2, 30d supply, fill #0

## 2023-09-15 NOTE — Progress Notes (Signed)
   S: Patient presents today for review of their specialty medication.   Patient is currently taking Cosentyx (secukinumab) for psoriasis. Patient is managed by Azucena Fallen for this.   Dosing: Psoriatic arthritis: SubQ: With a loading dose: 150 mg at weeks 0, 1, 2, 3, and 4 followed by 150 mg every 4 weeks; consider an increase to 300 mg every 4 weeks in patients who continue to have active psoriatic arthritis. Without a loading dose: 150 mg every 4 weeks; consider an increase to 300 mg every 4 weeks in patients who continue to have active psoriatic arthritis. Coexistent moderate to severe plaque psoriasis: 300 mg once weekly at weeks 0, 1, 2, 3, and 4 followed by 300 mg every 4 weeks. Some patients may only require 150 mg per dose.  Adherence: confirms  Efficacy: admits that she is having a flare currently. Endorses flares when the weather changes, however, her main motivation for not changing is side effects with other biologics tried in the past.   Current adverse effects: S/sx of infection: none GI upset: none Headache: none S/sx of hypersensitivity: none  O: Lab Results  Component Value Date   WBC 12.5 (H) 04/29/2017   HGB 11.9 (L) 04/29/2017   HCT 34.2 (L) 04/29/2017   MCV 89.3 04/29/2017   PLT 233 04/29/2017     Chemistry      Component Value Date/Time   NA 135 04/21/2017 1345   K 4.5 04/21/2017 1345   CL 101 04/21/2017 1345   CO2 29 04/21/2017 1345   BUN 7 04/21/2017 1345   CREATININE 0.64 04/21/2017 1345      Component Value Date/Time   CALCIUM 9.9 04/21/2017 1345     A/P: 1. Medication review: Patient is taking Cosentyx for psoriatic arthritis. Reviewed the medication with the patient, including the following: Cosentyx is a monoclonal antibody used in the treatment of ankylosing spondylitis, psoriasis, and psoriatic arthritis. The injection is subq and the medication should be allowed to reach room temp prior to injecting. Injection sites should be rotated.  Possible adverse effects include headaches, GI upset, increased risk of infection and hypersensitivity reactions. Recommend discussion of alternative treatment options given potential lack of efficacy with current treatment.   Butch Penny, PharmD, [place]y [place], CPP Clinical Pharmacist [facility] & [facility] [phone]

## 2023-09-16 ENCOUNTER — Other Ambulatory Visit: Payer: Self-pay

## 2023-09-16 ENCOUNTER — Other Ambulatory Visit (HOSPITAL_COMMUNITY): Payer: Self-pay

## 2023-09-16 DIAGNOSIS — Z6820 Body mass index (BMI) 20.0-20.9, adult: Secondary | ICD-10-CM | POA: Diagnosis not present

## 2023-09-16 DIAGNOSIS — L4059 Other psoriatic arthropathy: Secondary | ICD-10-CM | POA: Diagnosis not present

## 2023-09-16 DIAGNOSIS — R252 Cramp and spasm: Secondary | ICD-10-CM | POA: Diagnosis not present

## 2023-09-16 DIAGNOSIS — R5382 Chronic fatigue, unspecified: Secondary | ICD-10-CM | POA: Diagnosis not present

## 2023-09-16 DIAGNOSIS — L409 Psoriasis, unspecified: Secondary | ICD-10-CM | POA: Diagnosis not present

## 2023-09-16 DIAGNOSIS — M25511 Pain in right shoulder: Secondary | ICD-10-CM | POA: Diagnosis not present

## 2023-09-16 DIAGNOSIS — M2550 Pain in unspecified joint: Secondary | ICD-10-CM | POA: Diagnosis not present

## 2023-09-16 MED ORDER — TRAMADOL HCL 50 MG PO TABS
50.0000 mg | ORAL_TABLET | Freq: Three times a day (TID) | ORAL | 0 refills | Status: AC | PRN
  Filled 2023-09-16: qty 90, 30d supply, fill #0

## 2023-09-18 ENCOUNTER — Encounter (HOSPITAL_COMMUNITY): Payer: Self-pay

## 2023-09-30 ENCOUNTER — Other Ambulatory Visit (HOSPITAL_COMMUNITY): Payer: Self-pay

## 2023-09-30 ENCOUNTER — Other Ambulatory Visit: Payer: Self-pay

## 2023-09-30 MED ORDER — FAMOTIDINE 20 MG PO TABS
20.0000 mg | ORAL_TABLET | Freq: Every evening | ORAL | 3 refills | Status: AC | PRN
  Filled 2023-09-30: qty 30, 30d supply, fill #0
  Filled 2023-10-29: qty 30, 30d supply, fill #1
  Filled 2023-11-28: qty 30, 30d supply, fill #2
  Filled 2024-02-26 – 2024-03-16 (×3): qty 30, 30d supply, fill #3

## 2023-10-07 ENCOUNTER — Other Ambulatory Visit: Payer: Self-pay

## 2023-10-07 ENCOUNTER — Other Ambulatory Visit: Payer: Self-pay | Admitting: Pharmacist

## 2023-10-07 MED ORDER — COSENTYX (300 MG DOSE) 150 MG/ML ~~LOC~~ SOSY
PREFILLED_SYRINGE | SUBCUTANEOUS | 4 refills | Status: DC
Start: 2023-10-07 — End: 2023-11-01
  Filled 2023-10-07 – 2023-11-01 (×2): qty 2, 28d supply, fill #0

## 2023-10-07 MED ORDER — COSENTYX (300 MG DOSE) 150 MG/ML ~~LOC~~ SOSY
PREFILLED_SYRINGE | SUBCUTANEOUS | 4 refills | Status: DC
Start: 2023-10-07 — End: 2023-10-07

## 2023-10-07 NOTE — Progress Notes (Signed)
Specialty Pharmacy Refill Coordination Note  Yolanda Lawson is a 48 y.o. female contacted today regarding refills of specialty medication(s) Secukinumab   Patient requested Delivery   Delivery date: 10/14/23   Verified address: [address], [place], [address]   Medication will be filled on 10/13/23.

## 2023-10-08 ENCOUNTER — Other Ambulatory Visit: Payer: Self-pay

## 2023-10-13 ENCOUNTER — Other Ambulatory Visit: Payer: Self-pay

## 2023-10-20 ENCOUNTER — Other Ambulatory Visit: Payer: Self-pay

## 2023-10-20 ENCOUNTER — Other Ambulatory Visit: Payer: Self-pay | Admitting: Pharmacist

## 2023-10-20 ENCOUNTER — Other Ambulatory Visit (HOSPITAL_COMMUNITY): Payer: Self-pay

## 2023-10-20 MED ORDER — COSENTYX SENSOREADY (300 MG) 150 MG/ML ~~LOC~~ SOAJ
SUBCUTANEOUS | 4 refills | Status: DC
Start: 2023-10-20 — End: 2023-10-20

## 2023-10-20 MED ORDER — COSENTYX SENSOREADY (300 MG) 150 MG/ML ~~LOC~~ SOAJ
SUBCUTANEOUS | 4 refills | Status: DC
  Filled 2023-10-20: qty 2, 30d supply, fill #0

## 2023-10-21 ENCOUNTER — Other Ambulatory Visit: Payer: Self-pay

## 2023-10-21 ENCOUNTER — Other Ambulatory Visit (HOSPITAL_COMMUNITY): Payer: Self-pay

## 2023-10-21 NOTE — Progress Notes (Signed)
Shipping 10/24 for 10/25.

## 2023-10-28 ENCOUNTER — Other Ambulatory Visit (HOSPITAL_COMMUNITY): Payer: Self-pay

## 2023-10-28 ENCOUNTER — Other Ambulatory Visit: Payer: Self-pay

## 2023-10-28 DIAGNOSIS — M25511 Pain in right shoulder: Secondary | ICD-10-CM | POA: Diagnosis not present

## 2023-10-28 DIAGNOSIS — R5382 Chronic fatigue, unspecified: Secondary | ICD-10-CM | POA: Diagnosis not present

## 2023-10-28 DIAGNOSIS — L4059 Other psoriatic arthropathy: Secondary | ICD-10-CM | POA: Diagnosis not present

## 2023-10-28 DIAGNOSIS — M2550 Pain in unspecified joint: Secondary | ICD-10-CM | POA: Diagnosis not present

## 2023-10-28 DIAGNOSIS — R252 Cramp and spasm: Secondary | ICD-10-CM | POA: Diagnosis not present

## 2023-10-28 DIAGNOSIS — Z6820 Body mass index (BMI) 20.0-20.9, adult: Secondary | ICD-10-CM | POA: Diagnosis not present

## 2023-10-28 DIAGNOSIS — L409 Psoriasis, unspecified: Secondary | ICD-10-CM | POA: Diagnosis not present

## 2023-10-28 MED ORDER — GABAPENTIN 400 MG PO CAPS
400.0000 mg | ORAL_CAPSULE | Freq: Every day | ORAL | 6 refills | Status: DC
  Filled 2023-10-28: qty 30, 30d supply, fill #0
  Filled 2023-11-28: qty 30, 30d supply, fill #1
  Filled 2023-12-25: qty 30, 30d supply, fill #2
  Filled 2024-01-29: qty 30, 30d supply, fill #3
  Filled 2024-02-26: qty 30, 30d supply, fill #4
  Filled 2024-03-29: qty 30, 30d supply, fill #5
  Filled 2024-05-04: qty 30, 30d supply, fill #6

## 2023-10-29 ENCOUNTER — Other Ambulatory Visit (HOSPITAL_COMMUNITY): Payer: Self-pay

## 2023-11-01 ENCOUNTER — Other Ambulatory Visit: Payer: Self-pay

## 2023-11-01 ENCOUNTER — Ambulatory Visit: Payer: Commercial Managed Care - PPO | Attending: Internal Medicine | Admitting: Pharmacist

## 2023-11-01 DIAGNOSIS — Z79899 Other long term (current) drug therapy: Secondary | ICD-10-CM

## 2023-11-01 MED ORDER — BIMZELX 160 MG/ML ~~LOC~~ SOAJ
SUBCUTANEOUS | 5 refills | Status: DC
Start: 2023-11-01 — End: 2023-11-01

## 2023-11-01 MED ORDER — BIMZELX 160 MG/ML ~~LOC~~ SOAJ
SUBCUTANEOUS | 5 refills | Status: DC
  Filled 2023-11-02: qty 1, 28d supply, fill #0
  Filled 2023-11-02: qty 1, fill #0
  Filled 2023-11-03: qty 1, 28d supply, fill #0
  Filled 2024-10-10: qty 1, 30d supply, fill #1

## 2023-11-01 NOTE — Progress Notes (Signed)
Pharmacy Patient Advocate Encounter   Received notification from Patient Pharmacy that prior authorization for Bimzelx is required/requested.   Insurance verification completed.   The patient is insured through [facility] .   Per test claim: PA required; PA submitted to above mentioned insurance via CoverMyMeds Key/confirmation #/EOC [id number] Status is pending

## 2023-11-01 NOTE — Progress Notes (Signed)
   S:   Patient is currently taking Bimzelx for psoriasis. Patient is managed by Dr. Dierdre Forth for this.    Dosing: 160 mg once every 30 days   Adherence: has not started   Efficacy: was on Cosentyx but was not as effective.    TB testing: completed    Monitoring:  -LFTs: monitored by her specialist  -S/sx of infection: none -GI distress: none    Current adverse effects: -Acne/folliculitis: none -Injection site reaction: none -HA: none -Fatigue: none  -Cough: none      O:     Lab Results  Component Value Date   WBC 12.5 (H) 04/29/2017   HGB 11.9 (L) 04/29/2017   HCT 34.2 (L) 04/29/2017   MCV 89.3 04/29/2017   PLT 233 04/29/2017      Chemistry      Component Value Date/Time   NA 135 04/21/2017 1345   K 4.5 04/21/2017 1345   CL 101 04/21/2017 1345   CO2 29 04/21/2017 1345   BUN 7 04/21/2017 1345   CREATININE 0.64 04/21/2017 1345      Component Value Date/Time   CALCIUM 9.9 04/21/2017 1345       A/P: 1. Medication review: patient currently on Bimzelx for psoriasis. She is tolerating it well with good efficacy. Reviewed the medication with her. Bimekizumab-bkzx is a humanized immunoglobulin IgG1/kappa monoclonal antibody that selectively binds to IL-17A, IL-68F, and interleukin 17-AF cytokines, and inhibits their interaction with the IL-17 receptor complex. Common side effects include: injection site reactions, GI upset, cough, HA, and fatigue. Less common side effects include LFT elevation, IBD, and psychiatric changes. Patients should be monitored for S/sx of infection, GI distress. LFTs should be monitored periodically. Patient has no questions at this time.    Administration/storage: Before injecting, remove the carton from the refrigerator and allow to reach room temperature (30 to 45 minutes). Do not remove the prefilled syringes or autoinjectors from the carton until ready for use to protect from light; do not shake.Prefilled syringes or autoinjectors may be  stored at room temperature up to 77 degrees F (25 degrees C) for up to 30 days in the original carton. Inject each separate 160 mg single-dose prefilled syringe or autoinjector subQ at a different anatomic location (such as thighs, abdomen or back of upper arm). Do not inject within 2 inches (5 cm) of the navel or into areas where the skin is tender, bruised, red, hard, thick, scaly, or affected by psoriasis.    Butch Penny, PharmD, [place]y [place], CPP Clinical Pharmacist [facility] & [facility] [phone]

## 2023-11-02 ENCOUNTER — Other Ambulatory Visit: Payer: Self-pay

## 2023-11-02 ENCOUNTER — Other Ambulatory Visit: Payer: Self-pay | Admitting: Pharmacist

## 2023-11-02 ENCOUNTER — Other Ambulatory Visit (HOSPITAL_COMMUNITY): Payer: Self-pay

## 2023-11-02 MED ORDER — BIMZELX 160 MG/ML ~~LOC~~ SOAJ
SUBCUTANEOUS | 5 refills | Status: DC
Start: 2023-11-02 — End: 2024-10-10

## 2023-11-02 NOTE — Progress Notes (Signed)
Pharmacy Patient Advocate Encounter  Received notification from [facility] that Prior Authorization for Bimzelx has been APPROVED from 11/02/23 to 04/30/24   PA #/Case ID/Reference #: [id number]

## 2023-11-02 NOTE — Progress Notes (Signed)
Specialty Pharmacy Initial Fill Coordination Note  Yolanda Lawson is a 47 y.o. female contacted today regarding refills of specialty medication(s) Bimekizumab-Bkzx   Patient requested Delivery   Delivery date: 11/04/23   Verified address: [address]   Medication will be filled on 11/6.   Patient is aware of $0 copayment.

## 2023-11-02 NOTE — Progress Notes (Signed)
Please see OV 11/01/2023 for complete documentation.   Butch Penny, PharmD, [place]y [place], CPP Clinical Pharmacist [facility] & [facility] [phone]

## 2023-11-03 ENCOUNTER — Other Ambulatory Visit: Payer: Self-pay

## 2023-11-03 ENCOUNTER — Other Ambulatory Visit (HOSPITAL_COMMUNITY): Payer: Self-pay

## 2023-11-04 ENCOUNTER — Other Ambulatory Visit: Payer: Self-pay

## 2023-11-04 ENCOUNTER — Other Ambulatory Visit (HOSPITAL_COMMUNITY): Payer: Self-pay

## 2023-11-05 ENCOUNTER — Other Ambulatory Visit: Payer: Self-pay

## 2023-11-08 ENCOUNTER — Other Ambulatory Visit: Payer: Self-pay

## 2023-11-18 ENCOUNTER — Other Ambulatory Visit: Payer: Self-pay

## 2023-11-24 ENCOUNTER — Other Ambulatory Visit: Payer: Self-pay

## 2023-11-28 ENCOUNTER — Other Ambulatory Visit (HOSPITAL_COMMUNITY): Payer: Self-pay

## 2023-11-29 ENCOUNTER — Other Ambulatory Visit: Payer: Self-pay

## 2023-11-30 ENCOUNTER — Other Ambulatory Visit: Payer: Self-pay

## 2023-11-30 ENCOUNTER — Other Ambulatory Visit (HOSPITAL_COMMUNITY): Payer: Self-pay

## 2023-11-30 MED ORDER — DIAZEPAM 5 MG PO TABS
5.0000 mg | ORAL_TABLET | Freq: Two times a day (BID) | ORAL | 2 refills | Status: DC | PRN
  Filled 2023-11-30 (×2): qty 60, 30d supply, fill #0
  Filled 2024-01-29: qty 60, 30d supply, fill #1
  Filled 2024-04-17: qty 60, 30d supply, fill #2

## 2023-12-13 ENCOUNTER — Other Ambulatory Visit: Payer: Self-pay

## 2023-12-13 NOTE — Progress Notes (Signed)
Disenrolled patient from Specialty Services-- patient had side effects from medication. Will speak with MD at next appointment in January.

## 2023-12-14 ENCOUNTER — Other Ambulatory Visit: Payer: Self-pay

## 2023-12-25 ENCOUNTER — Other Ambulatory Visit (HOSPITAL_COMMUNITY): Payer: Self-pay

## 2023-12-27 ENCOUNTER — Other Ambulatory Visit: Payer: Self-pay

## 2024-01-03 ENCOUNTER — Other Ambulatory Visit (HOSPITAL_COMMUNITY): Payer: Self-pay

## 2024-01-10 ENCOUNTER — Encounter (HOSPITAL_COMMUNITY): Payer: Self-pay

## 2024-01-11 ENCOUNTER — Other Ambulatory Visit (HOSPITAL_COMMUNITY): Payer: Self-pay

## 2024-01-29 ENCOUNTER — Other Ambulatory Visit (HOSPITAL_COMMUNITY): Payer: Self-pay

## 2024-01-31 ENCOUNTER — Other Ambulatory Visit: Payer: Self-pay

## 2024-01-31 ENCOUNTER — Other Ambulatory Visit (HOSPITAL_COMMUNITY): Payer: Self-pay

## 2024-02-01 ENCOUNTER — Other Ambulatory Visit (HOSPITAL_COMMUNITY): Payer: Self-pay

## 2024-02-01 ENCOUNTER — Other Ambulatory Visit: Payer: Self-pay

## 2024-02-03 ENCOUNTER — Other Ambulatory Visit (HOSPITAL_COMMUNITY): Payer: Self-pay

## 2024-02-03 MED ORDER — BUTALBITAL-ACETAMINOPHEN 50-325 MG PO TABS
1.0000 | ORAL_TABLET | ORAL | 2 refills | Status: AC | PRN
  Filled 2024-02-03: qty 180, 30d supply, fill #0
  Filled 2024-05-29: qty 180, 30d supply, fill #1
  Filled 2024-07-28: qty 180, 30d supply, fill #2

## 2024-02-04 ENCOUNTER — Other Ambulatory Visit (HOSPITAL_COMMUNITY): Payer: Self-pay

## 2024-02-04 ENCOUNTER — Encounter (HOSPITAL_COMMUNITY): Payer: Self-pay

## 2024-02-04 MED ORDER — BUTALBITAL-ACETAMINOPHEN 50-325 MG PO TABS
1.0000 | ORAL_TABLET | ORAL | 2 refills | Status: DC | PRN
  Filled 2024-02-04 – 2024-04-17 (×2): qty 180, 30d supply, fill #0

## 2024-02-28 ENCOUNTER — Other Ambulatory Visit: Payer: Self-pay

## 2024-02-28 ENCOUNTER — Other Ambulatory Visit (HOSPITAL_COMMUNITY): Payer: Self-pay

## 2024-02-29 ENCOUNTER — Other Ambulatory Visit: Payer: Self-pay

## 2024-03-06 DIAGNOSIS — M79642 Pain in left hand: Secondary | ICD-10-CM | POA: Diagnosis not present

## 2024-03-06 DIAGNOSIS — L409 Psoriasis, unspecified: Secondary | ICD-10-CM | POA: Diagnosis not present

## 2024-03-06 DIAGNOSIS — G8929 Other chronic pain: Secondary | ICD-10-CM | POA: Diagnosis not present

## 2024-03-06 DIAGNOSIS — L4059 Other psoriatic arthropathy: Secondary | ICD-10-CM | POA: Diagnosis not present

## 2024-03-06 DIAGNOSIS — M1991 Primary osteoarthritis, unspecified site: Secondary | ICD-10-CM | POA: Diagnosis not present

## 2024-03-06 DIAGNOSIS — R5382 Chronic fatigue, unspecified: Secondary | ICD-10-CM | POA: Diagnosis not present

## 2024-03-06 DIAGNOSIS — M79641 Pain in right hand: Secondary | ICD-10-CM | POA: Diagnosis not present

## 2024-03-06 DIAGNOSIS — Z6820 Body mass index (BMI) 20.0-20.9, adult: Secondary | ICD-10-CM | POA: Diagnosis not present

## 2024-03-14 ENCOUNTER — Other Ambulatory Visit (HOSPITAL_COMMUNITY): Payer: Self-pay

## 2024-03-27 ENCOUNTER — Other Ambulatory Visit: Payer: Self-pay

## 2024-03-27 MED ORDER — ENBREL SURECLICK 50 MG/ML ~~LOC~~ SOAJ
SUBCUTANEOUS | 0 refills | Status: DC
Start: 2024-03-27 — End: 2024-03-29

## 2024-03-27 MED ORDER — ENBREL SURECLICK 50 MG/ML ~~LOC~~ SOAJ
SUBCUTANEOUS | 5 refills | Status: DC
Start: 2024-03-13 — End: 2024-04-18

## 2024-03-29 ENCOUNTER — Other Ambulatory Visit: Payer: Self-pay

## 2024-03-29 ENCOUNTER — Other Ambulatory Visit (HOSPITAL_COMMUNITY): Payer: Self-pay

## 2024-03-29 ENCOUNTER — Telehealth: Payer: Self-pay | Admitting: Pharmacist

## 2024-03-29 ENCOUNTER — Encounter: Payer: Self-pay | Admitting: Pharmacist

## 2024-03-29 ENCOUNTER — Ambulatory Visit: Attending: Internal Medicine | Admitting: Pharmacist

## 2024-03-29 ENCOUNTER — Other Ambulatory Visit: Payer: Self-pay | Admitting: Pharmacist

## 2024-03-29 DIAGNOSIS — Z79899 Other long term (current) drug therapy: Secondary | ICD-10-CM

## 2024-03-29 MED ORDER — ENBREL SURECLICK 50 MG/ML ~~LOC~~ SOAJ
SUBCUTANEOUS | 0 refills | Status: DC
  Filled 2024-03-29: qty 4, 28d supply, fill #0

## 2024-03-29 MED ORDER — PROPRANOLOL HCL 60 MG PO TABS
120.0000 mg | ORAL_TABLET | Freq: Every day | ORAL | 6 refills | Status: DC
  Filled 2024-03-29: qty 60, 30d supply, fill #0
  Filled 2024-05-04: qty 60, 30d supply, fill #1
  Filled 2024-05-29: qty 60, 30d supply, fill #2
  Filled 2024-06-28: qty 60, 30d supply, fill #3
  Filled 2024-07-28: qty 60, 30d supply, fill #4
  Filled 2024-09-04: qty 60, 30d supply, fill #5
  Filled 2024-09-29: qty 60, 30d supply, fill #6

## 2024-03-29 NOTE — Telephone Encounter (Signed)
 Called patient to schedule an appointment for the [facility] [employer] Specialty Medication Clinic. I was unable to reach the patient so I left a HIPAA-compliant message requesting that the patient return my call.   Butch Penny, PharmD, [place], CPP Clinical Pharmacist [facility] & [facility] [phone]

## 2024-03-29 NOTE — Addendum Note (Signed)
 Addended by: Lois Huxley, Alyaan Budzynski L on: 03/29/2024 11:01 AM   Modules accepted: Orders

## 2024-03-29 NOTE — Progress Notes (Signed)
 See OV from 03/29/2024 for documentation.   Butch Penny, PharmD, P[place]y [place], CPP Clinical Pharmacist [facility] & [facility] [phone]

## 2024-03-29 NOTE — Progress Notes (Signed)
 Pharmacy Patient Advocate Encounter  Insurance verification completed.   The patient is insured through [facility]   Ran test claim for Enbrel. Co-pay is $0. Patient has copay card.   This test claim was processed through [facility]- copay amounts may vary at other pharmacies due to pharmacy/plan contracts, or as the patient moves through the different stages of their insurance plan.

## 2024-03-29 NOTE — Progress Notes (Addendum)
   S: Patient presents for review of their specialty medication therapy.  Patient is about to start taking Enbrel for psoriasis. Patient is managed by Dr. Dierdre Forth for this.   Adherence: has not started yet, new to Enbrel  Efficacy: has not started yet, new to Enbrel  Dosing: Ankylosing spondylitis, psoriatic arthritis, rheumatoid arthritis: SubQ: Note: May continue methotrexate, glucocorticoids, salicylates, NSAIDs, or analgesics during etanercept therapy. Once-weekly dosing: 50 mg once weekly; maximum dose (rheumatoid arthritis): 50 mg/week.  Screening: TB test: completed  Hepatitis B: completed  Monitoring: Injection site reactions: none S/sx of infections: none S/sx of malignancy: none  GI upset: none    O:     Lab Results  Component Value Date   WBC 12.5 (H) 04/29/2017   HGB 11.9 (L) 04/29/2017   HCT 34.2 (L) 04/29/2017   MCV 89.3 04/29/2017   PLT 233 04/29/2017      Chemistry      Component Value Date/Time   NA 135 04/21/2017 1345   K 4.5 04/21/2017 1345   CL 101 04/21/2017 1345   CO2 29 04/21/2017 1345   BUN 7 04/21/2017 1345   CREATININE 0.64 04/21/2017 1345      Component Value Date/Time   CALCIUM 9.9 04/21/2017 1345       A/P: 1. Medication review: patient is about to start on Enbrel for psoriasis. Reviewed the medication with the patient, including the following: Enbrel (etanercept) binds tumor necrosis factor (TNF) and blocks its interaction with cell surface receptors. TNF plays an important role in the inflammatory processes of many diseases. Patient educated on purpose, proper use and potential adverse effects of Enbrel. SubQ: Administer subcutaneously. Rotate injection sites; may inject into the thigh (preferred), abdomen (avoiding the 2-inch area around the navel), or outer areas of upper arm. New injections should be given at least 1 inch from an old site and never into areas where the skin is tender, bruised, red, or hard; any raised thick,  red, or scaly skin patches or lesions; or areas with scars or stretch marks. For a more comfortable injection, allow autoinjectors, prefilled syringes, and dose trays to reach room temperature for 15 to 30 minutes (>=30 minutes for autoinjector) prior to injection; do not remove the needle cover while allowing product to reach room temperature. There may be small white particles of protein in the solution; this is not unusual for proteinaceous solutions. Possible adverse effects include rash, GI upset, increased risk of infection, and injection site reactions. Patients should stop Enbrel if they develop a serious infection. There is a possible increased risk in lymphoma and other malignancies. No recommendations for any changes.   Butch Penny, PharmD, [place], CPP Clinical Pharmacist [facility] & [facility] [phone]

## 2024-03-29 NOTE — Progress Notes (Signed)
 Specialty Pharmacy Initial Fill Coordination Note  Yolanda Lawson is a 48 y.o. female contacted today regarding initial fill of specialty medication(s) Etanercept (Enbrel SureClick)   Patient requested Delivery   Delivery date: 03/31/24   Verified address: [address]   Medication will be filled on 4/3.   Patient is aware of $0 copayment.

## 2024-04-17 ENCOUNTER — Other Ambulatory Visit (HOSPITAL_COMMUNITY): Payer: Self-pay

## 2024-04-17 ENCOUNTER — Other Ambulatory Visit: Payer: Self-pay

## 2024-04-18 ENCOUNTER — Other Ambulatory Visit (HOSPITAL_COMMUNITY): Payer: Self-pay

## 2024-04-18 ENCOUNTER — Other Ambulatory Visit: Payer: Self-pay

## 2024-04-18 ENCOUNTER — Other Ambulatory Visit: Payer: Self-pay | Admitting: Pharmacist

## 2024-04-18 MED ORDER — ENBREL SURECLICK 50 MG/ML ~~LOC~~ SOAJ
SUBCUTANEOUS | 5 refills | Status: DC
  Filled 2024-04-18: qty 4, fill #0
  Filled 2024-04-20: qty 4, 30d supply, fill #0
  Filled 2024-05-29: qty 4, 30d supply, fill #1
  Filled 2024-07-10: qty 4, 30d supply, fill #2
  Filled 2024-08-14: qty 4, 28d supply, fill #3
  Filled 2024-09-11: qty 4, 28d supply, fill #4
  Filled 2024-10-09 – 2024-10-10 (×2): qty 4, 28d supply, fill #5
  Filled 2024-10-10: qty 4, 30d supply, fill #5
  Filled 2024-10-11: qty 4, 28d supply, fill #5

## 2024-04-20 ENCOUNTER — Other Ambulatory Visit: Payer: Self-pay

## 2024-04-20 NOTE — Progress Notes (Addendum)
 Specialty Pharmacy Refill Coordination Note  Yolanda Lawson is a 48 y.o. female contacted today regarding refills of specialty medication(s) Etanercept  (Enbrel  SureClick)   Patient requested Delivery   Delivery date: 05/04/24   Verified address: [address]   Medication will be filled on 05/03/24.     Needs PA. Routed to Tiffany.

## 2024-05-03 ENCOUNTER — Other Ambulatory Visit: Payer: Self-pay

## 2024-05-03 NOTE — Progress Notes (Signed)
PA submitted and pt notified

## 2024-05-03 NOTE — Progress Notes (Signed)
 Pharmacy Patient Advocate Encounter   Received notification from Patient Pharmacy that prior authorization for Enbrel  is required/requested.   Insurance verification completed.   The patient is insured through [facility] .   Per test claim: PA required; PA submitted to above mentioned insurance via CoverMyMeds Key/confirmation #/EOC [id number] Status is pending

## 2024-05-04 ENCOUNTER — Other Ambulatory Visit: Payer: Self-pay

## 2024-05-04 NOTE — Progress Notes (Signed)
 PA already on file with an expiration date of 09/19/24. Prescription needed to be submitted for 30 days instead of 28. Pt updated

## 2024-05-10 ENCOUNTER — Other Ambulatory Visit: Payer: Self-pay

## 2024-05-12 ENCOUNTER — Other Ambulatory Visit: Payer: Self-pay

## 2024-05-29 ENCOUNTER — Other Ambulatory Visit: Payer: Self-pay

## 2024-05-29 ENCOUNTER — Other Ambulatory Visit (HOSPITAL_COMMUNITY): Payer: Self-pay

## 2024-05-29 MED ORDER — GABAPENTIN 400 MG PO CAPS
400.0000 mg | ORAL_CAPSULE | Freq: Every day | ORAL | 1 refills | Status: DC
  Filled 2024-06-04: qty 30, 30d supply, fill #0
  Filled 2024-07-04: qty 30, 30d supply, fill #1

## 2024-05-29 NOTE — Progress Notes (Signed)
 Specialty Pharmacy Refill Coordination Note  Yolanda Lawson is a 48 y.o. female contacted today regarding refills of specialty medication(s) Etanercept  (Enbrel  SureClick)   Patient requested Delivery   Delivery date: 06/15/24   Verified address: [address]   Medication will be filled on 06.18.25.

## 2024-05-30 ENCOUNTER — Other Ambulatory Visit (HOSPITAL_COMMUNITY): Payer: Self-pay

## 2024-06-01 ENCOUNTER — Other Ambulatory Visit: Payer: Self-pay

## 2024-06-05 ENCOUNTER — Other Ambulatory Visit: Payer: Self-pay

## 2024-06-13 ENCOUNTER — Other Ambulatory Visit (HOSPITAL_COMMUNITY): Payer: Self-pay | Admitting: Internal Medicine

## 2024-06-13 DIAGNOSIS — Z1231 Encounter for screening mammogram for malignant neoplasm of breast: Secondary | ICD-10-CM

## 2024-06-14 ENCOUNTER — Other Ambulatory Visit: Payer: Self-pay

## 2024-06-26 ENCOUNTER — Ambulatory Visit (HOSPITAL_COMMUNITY)

## 2024-06-28 ENCOUNTER — Other Ambulatory Visit (HOSPITAL_COMMUNITY): Payer: Self-pay

## 2024-06-28 ENCOUNTER — Other Ambulatory Visit: Payer: Self-pay

## 2024-06-28 MED ORDER — DIAZEPAM 5 MG PO TABS
5.0000 mg | ORAL_TABLET | Freq: Two times a day (BID) | ORAL | 2 refills | Status: DC
  Filled 2024-06-28: qty 60, 30d supply, fill #0
  Filled 2024-07-28: qty 60, 30d supply, fill #1
  Filled 2024-09-29: qty 60, 30d supply, fill #2

## 2024-07-04 ENCOUNTER — Other Ambulatory Visit (HOSPITAL_COMMUNITY): Payer: Self-pay

## 2024-07-04 ENCOUNTER — Other Ambulatory Visit: Payer: Self-pay

## 2024-07-07 ENCOUNTER — Other Ambulatory Visit (HOSPITAL_COMMUNITY): Payer: Self-pay

## 2024-07-10 ENCOUNTER — Ambulatory Visit (HOSPITAL_COMMUNITY)

## 2024-07-10 ENCOUNTER — Other Ambulatory Visit: Payer: Self-pay

## 2024-07-10 NOTE — Progress Notes (Signed)
 Specialty Pharmacy Refill Coordination Note  SHARISSE RANTZ is a 48 y.o. female contacted today regarding refills of specialty medication(s) Etanercept  (Enbrel  SureClick)   Patient requested Delivery   Delivery date: 07/12/24   Verified address: (Patient-Rptd) [address], [place], [place] [address]   Medication will be filled on 07/11/24.

## 2024-07-24 ENCOUNTER — Ambulatory Visit (HOSPITAL_COMMUNITY)

## 2024-07-28 ENCOUNTER — Other Ambulatory Visit (HOSPITAL_COMMUNITY): Payer: Self-pay

## 2024-07-28 ENCOUNTER — Other Ambulatory Visit: Payer: Self-pay

## 2024-07-31 ENCOUNTER — Other Ambulatory Visit (HOSPITAL_COMMUNITY): Payer: Self-pay

## 2024-07-31 MED ORDER — GABAPENTIN 400 MG PO CAPS
400.0000 mg | ORAL_CAPSULE | Freq: Every day | ORAL | 1 refills | Status: DC
  Filled 2024-08-04: qty 30, 30d supply, fill #0
  Filled 2024-09-04: qty 30, 30d supply, fill #1

## 2024-08-04 ENCOUNTER — Other Ambulatory Visit: Payer: Self-pay

## 2024-08-04 ENCOUNTER — Other Ambulatory Visit (HOSPITAL_COMMUNITY): Payer: Self-pay

## 2024-08-14 ENCOUNTER — Other Ambulatory Visit: Payer: Self-pay | Admitting: Pharmacy Technician

## 2024-08-14 ENCOUNTER — Other Ambulatory Visit: Payer: Self-pay

## 2024-08-14 NOTE — Progress Notes (Signed)
 Specialty Pharmacy Refill Coordination Note  Yolanda Lawson is a 48 y.o. female contacted today regarding refills of specialty medication(s) Etanercept  (Enbrel  SureClick)   Patient requested Delivery   Delivery date: 08/18/24   Verified address: [address] [place]   Medication will be filled on 08/17/24.

## 2024-08-16 ENCOUNTER — Other Ambulatory Visit: Payer: Self-pay

## 2024-08-17 ENCOUNTER — Other Ambulatory Visit: Payer: Self-pay

## 2024-08-21 ENCOUNTER — Ambulatory Visit (HOSPITAL_COMMUNITY)
Admission: RE | Admit: 2024-08-21 | Discharge: 2024-08-21 | Disposition: A | Discharge status: Home / Self Care | Source: Ambulatory Visit | Attending: Internal Medicine | Admitting: Internal Medicine

## 2024-08-21 DIAGNOSIS — Z1231 Encounter for screening mammogram for malignant neoplasm of breast: Secondary | ICD-10-CM | POA: Diagnosis not present

## 2024-08-24 ENCOUNTER — Other Ambulatory Visit: Payer: Self-pay

## 2024-08-24 ENCOUNTER — Encounter (HOSPITAL_COMMUNITY): Payer: Self-pay

## 2024-08-24 ENCOUNTER — Other Ambulatory Visit (HOSPITAL_COMMUNITY): Payer: Self-pay

## 2024-08-24 DIAGNOSIS — Z13220 Encounter for screening for lipoid disorders: Secondary | ICD-10-CM | POA: Diagnosis not present

## 2024-08-24 DIAGNOSIS — E041 Nontoxic single thyroid nodule: Secondary | ICD-10-CM | POA: Diagnosis not present

## 2024-08-24 MED ORDER — ESCITALOPRAM OXALATE 10 MG PO TABS
10.0000 mg | ORAL_TABLET | Freq: Every day | ORAL | 1 refills | Status: DC
  Filled 2024-08-24: qty 30, 30d supply, fill #0
  Filled 2024-09-19: qty 30, 30d supply, fill #1

## 2024-08-24 MED ORDER — TRAMADOL HCL 50 MG PO TABS
50.0000 mg | ORAL_TABLET | Freq: Three times a day (TID) | ORAL | 0 refills | Status: AC | PRN
  Filled 2024-08-24: qty 90, 30d supply, fill #0

## 2024-08-25 ENCOUNTER — Other Ambulatory Visit (HOSPITAL_COMMUNITY): Payer: Self-pay

## 2024-08-30 ENCOUNTER — Encounter (INDEPENDENT_AMBULATORY_CARE_PROVIDER_SITE_OTHER): Payer: Self-pay | Admitting: *Deleted

## 2024-09-04 ENCOUNTER — Other Ambulatory Visit: Payer: Self-pay

## 2024-09-04 ENCOUNTER — Other Ambulatory Visit (HOSPITAL_COMMUNITY): Payer: Self-pay

## 2024-09-04 ENCOUNTER — Other Ambulatory Visit (HOSPITAL_COMMUNITY): Payer: Self-pay | Admitting: Internal Medicine

## 2024-09-04 DIAGNOSIS — M8580 Other specified disorders of bone density and structure, unspecified site: Secondary | ICD-10-CM

## 2024-09-05 ENCOUNTER — Other Ambulatory Visit (HOSPITAL_COMMUNITY): Payer: Self-pay | Admitting: Internal Medicine

## 2024-09-05 DIAGNOSIS — E041 Nontoxic single thyroid nodule: Secondary | ICD-10-CM

## 2024-09-11 ENCOUNTER — Other Ambulatory Visit: Payer: Self-pay | Admitting: Pharmacy Technician

## 2024-09-11 ENCOUNTER — Other Ambulatory Visit: Payer: Self-pay

## 2024-09-11 NOTE — Progress Notes (Signed)
 Specialty Pharmacy Refill Coordination Note  Yolanda Lawson is a 48 y.o. female contacted today regarding refills of specialty medication(s) Etanercept  (Enbrel  SureClick)   Patient requested Delivery    Delivery date: 09/13/24   Verified address: [address], [place], [place] [address]   Medication will be filled on 09/12/24.  Injection dates: 9/18 & 9/26.  Questionnaire answered.

## 2024-09-11 NOTE — Progress Notes (Signed)
 Clinical Intervention Note  Clinical Intervention Notes: Patient reported starting escitalopram . No DDIs identified with Enbrel    Clinical Intervention Outcomes: Prevention of an adverse drug event   Advertising account planner

## 2024-09-12 ENCOUNTER — Other Ambulatory Visit: Payer: Self-pay

## 2024-09-19 ENCOUNTER — Other Ambulatory Visit: Payer: Self-pay

## 2024-09-27 ENCOUNTER — Other Ambulatory Visit (HOSPITAL_COMMUNITY): Payer: Self-pay

## 2024-09-27 ENCOUNTER — Other Ambulatory Visit: Payer: Self-pay

## 2024-09-27 MED ORDER — GABAPENTIN 400 MG PO CAPS
400.0000 mg | ORAL_CAPSULE | Freq: Every day | ORAL | 1 refills | Status: DC
  Filled 2024-10-02: qty 30, 30d supply, fill #0
  Filled 2024-10-30: qty 30, 30d supply, fill #1

## 2024-09-29 ENCOUNTER — Other Ambulatory Visit (HOSPITAL_COMMUNITY): Payer: Self-pay

## 2024-09-29 ENCOUNTER — Other Ambulatory Visit: Payer: Self-pay

## 2024-10-02 ENCOUNTER — Other Ambulatory Visit: Payer: Self-pay

## 2024-10-02 ENCOUNTER — Other Ambulatory Visit (HOSPITAL_COMMUNITY): Payer: Self-pay

## 2024-10-03 ENCOUNTER — Other Ambulatory Visit (HOSPITAL_COMMUNITY): Payer: Self-pay

## 2024-10-06 ENCOUNTER — Other Ambulatory Visit (HOSPITAL_COMMUNITY)

## 2024-10-09 ENCOUNTER — Other Ambulatory Visit (HOSPITAL_COMMUNITY): Payer: Self-pay

## 2024-10-09 ENCOUNTER — Encounter (HOSPITAL_COMMUNITY): Payer: Self-pay

## 2024-10-09 ENCOUNTER — Other Ambulatory Visit: Payer: Self-pay

## 2024-10-09 ENCOUNTER — Telehealth: Payer: Self-pay

## 2024-10-09 NOTE — Telephone Encounter (Signed)
 Pharmacy Patient Advocate Encounter   Received notification from Patient Pharmacy that prior authorization for Bimzelx  is required/requested.   Insurance verification completed.   The patient is insured through [facility].   Per test claim: PA required; PA submitted to above mentioned insurance via Latent Key/confirmation #/EOC [id number] Status is pending

## 2024-10-09 NOTE — Telephone Encounter (Signed)
 Pharmacy Patient Advocate Encounter  Received notification from [facility] that Prior Authorization for Bimzelx  has been APPROVED from 10/09/24 to 04/08/25   PA #/Case ID/Reference #: [id number]

## 2024-10-09 NOTE — Progress Notes (Signed)
 PA approved.

## 2024-10-10 ENCOUNTER — Other Ambulatory Visit (HOSPITAL_COMMUNITY): Payer: Self-pay

## 2024-10-10 ENCOUNTER — Other Ambulatory Visit: Payer: Self-pay

## 2024-10-10 ENCOUNTER — Telehealth: Payer: Self-pay

## 2024-10-10 NOTE — Telephone Encounter (Signed)
 Pharmacy Patient Advocate Encounter   Received notification from Patient Pharmacy that prior authorization for Enbrel  is required/requested.   Insurance verification completed.   The patient is insured through [facility].   Per test claim: PA required; PA submitted to above mentioned insurance via Latent Key/confirmation #/EOC [id number] Status is pending

## 2024-10-11 ENCOUNTER — Other Ambulatory Visit: Payer: Self-pay

## 2024-10-11 NOTE — Progress Notes (Signed)
 Specialty Pharmacy Refill Coordination Note  Yolanda Lawson is a 48 y.o. female contacted today regarding refills of specialty medication(s) Etanercept  (Enbrel  SureClick)   Patient requested Delivery   Delivery date: 10/12/24   Verified address: [address] [place], [place] [address]   Medication will be filled on 10/11/24.

## 2024-10-11 NOTE — Telephone Encounter (Signed)
 Pharmacy Patient Advocate Encounter  Received notification from [facility] that Prior Authorization for Enbrel  has been APPROVED from 10/10/24 to 10/09/25   PA #/Case ID/Reference #: [id number]

## 2024-10-17 ENCOUNTER — Other Ambulatory Visit: Payer: Self-pay

## 2024-10-17 ENCOUNTER — Ambulatory Visit (HOSPITAL_COMMUNITY)
Admission: RE | Admit: 2024-10-17 | Discharge: 2024-10-17 | Disposition: A | Discharge status: Home / Self Care | Source: Ambulatory Visit | Attending: Internal Medicine | Admitting: Internal Medicine

## 2024-10-17 ENCOUNTER — Other Ambulatory Visit (HOSPITAL_COMMUNITY): Payer: Self-pay

## 2024-10-17 DIAGNOSIS — E041 Nontoxic single thyroid nodule: Secondary | ICD-10-CM | POA: Insufficient documentation

## 2024-10-17 MED ORDER — ESCITALOPRAM OXALATE 10 MG PO TABS
10.0000 mg | ORAL_TABLET | Freq: Every day | ORAL | 1 refills | Status: DC
  Filled 2024-10-17: qty 30, 30d supply, fill #0
  Filled 2024-11-21: qty 30, 30d supply, fill #1

## 2024-10-23 ENCOUNTER — Other Ambulatory Visit (HOSPITAL_COMMUNITY): Payer: Self-pay

## 2024-10-30 ENCOUNTER — Encounter (HOSPITAL_COMMUNITY): Payer: Self-pay

## 2024-10-30 ENCOUNTER — Other Ambulatory Visit (HOSPITAL_COMMUNITY): Payer: Self-pay

## 2024-10-30 ENCOUNTER — Other Ambulatory Visit: Payer: Self-pay

## 2024-10-30 MED ORDER — BUTALBITAL-ACETAMINOPHEN 50-325 MG PO TABS
1.0000 | ORAL_TABLET | ORAL | 2 refills | Status: AC | PRN
  Filled 2024-10-30: qty 180, 30d supply, fill #0
  Filled 2025-01-16: qty 180, 30d supply, fill #1

## 2024-11-01 ENCOUNTER — Other Ambulatory Visit: Payer: Self-pay

## 2024-11-01 ENCOUNTER — Encounter (INDEPENDENT_AMBULATORY_CARE_PROVIDER_SITE_OTHER): Payer: Self-pay

## 2024-11-01 ENCOUNTER — Other Ambulatory Visit (HOSPITAL_COMMUNITY): Payer: Self-pay

## 2024-11-01 MED ORDER — PROPRANOLOL HCL 60 MG PO TABS
120.0000 mg | ORAL_TABLET | Freq: Every day | ORAL | 1 refills | Status: DC
  Filled 2024-11-01: qty 60, 30d supply, fill #0
  Filled 2024-11-28: qty 60, 30d supply, fill #1

## 2024-11-01 MED ORDER — FAMOTIDINE 20 MG PO TABS
20.0000 mg | ORAL_TABLET | Freq: Every evening | ORAL | 0 refills | Status: AC | PRN
  Filled 2024-11-01: qty 90, 90d supply, fill #0

## 2024-11-02 ENCOUNTER — Other Ambulatory Visit: Payer: Self-pay | Admitting: Pharmacy Technician

## 2024-11-02 ENCOUNTER — Other Ambulatory Visit: Payer: Self-pay

## 2024-11-02 ENCOUNTER — Other Ambulatory Visit (HOSPITAL_COMMUNITY): Payer: Self-pay

## 2024-11-02 NOTE — Progress Notes (Signed)
 Specialty Pharmacy Refill Coordination Note  Yolanda Lawson is a 48 y.o. female contacted today regarding refills of specialty medication(s) Etanercept  (Enbrel  SureClick)   Patient requested (Patient-Rptd) Delivery   Delivery date: 11/14/2024  Verified address: (Patient-Rptd) [address] [place], [place] [address]   Medication will be filled on: 11/13/2024  RR sent to MD; Send to [facility] for rewrite

## 2024-11-14 ENCOUNTER — Other Ambulatory Visit: Payer: Self-pay | Admitting: Internal Medicine

## 2024-11-15 ENCOUNTER — Other Ambulatory Visit: Payer: Self-pay

## 2024-11-15 NOTE — Progress Notes (Signed)
 Patient called back and is aware of the refill denial, Patient is to contact her provider regarding her Enbrel  refill.

## 2024-11-15 NOTE — Progress Notes (Addendum)
 Refill was denied on 11.5.25 by provider, patient needs appointment. Specialty pharmacy was unaware because outpatient resolved denial without informing spec. LVM for patient to followup

## 2024-11-16 ENCOUNTER — Other Ambulatory Visit (HOSPITAL_COMMUNITY): Payer: Self-pay

## 2024-11-16 ENCOUNTER — Other Ambulatory Visit: Payer: Self-pay

## 2024-11-16 ENCOUNTER — Other Ambulatory Visit: Payer: Self-pay | Admitting: Pharmacist

## 2024-11-16 DIAGNOSIS — M1991 Primary osteoarthritis, unspecified site: Secondary | ICD-10-CM | POA: Diagnosis not present

## 2024-11-16 DIAGNOSIS — L4059 Other psoriatic arthropathy: Secondary | ICD-10-CM | POA: Diagnosis not present

## 2024-11-16 DIAGNOSIS — Z6821 Body mass index (BMI) 21.0-21.9, adult: Secondary | ICD-10-CM | POA: Diagnosis not present

## 2024-11-16 DIAGNOSIS — L409 Psoriasis, unspecified: Secondary | ICD-10-CM | POA: Diagnosis not present

## 2024-11-16 DIAGNOSIS — R5382 Chronic fatigue, unspecified: Secondary | ICD-10-CM | POA: Diagnosis not present

## 2024-11-16 DIAGNOSIS — G8929 Other chronic pain: Secondary | ICD-10-CM | POA: Diagnosis not present

## 2024-11-16 MED ORDER — ENBREL SURECLICK 50 MG/ML ~~LOC~~ SOAJ
SUBCUTANEOUS | 5 refills | Status: DC
  Filled 2024-11-16: qty 4, 28d supply, fill #0

## 2024-11-16 MED ORDER — ENBREL SURECLICK 50 MG/ML ~~LOC~~ SOAJ
SUBCUTANEOUS | 5 refills | Status: AC
  Filled 2024-11-16: qty 4, 28d supply, fill #0
  Filled 2024-12-11: qty 4, 28d supply, fill #1
  Filled 2025-01-03: qty 4, 28d supply, fill #2
  Filled 2025-01-31: qty 4, 28d supply, fill #3

## 2024-11-16 MED ORDER — GABAPENTIN 400 MG PO CAPS
400.0000 mg | ORAL_CAPSULE | Freq: Every day | ORAL | 5 refills | Status: DC
  Filled 2024-11-28: qty 30, 30d supply, fill #0
  Filled 2024-12-26: qty 30, 30d supply, fill #1
  Filled 2025-01-24: qty 30, 30d supply, fill #2

## 2024-11-16 NOTE — Progress Notes (Signed)
 Called & Spoke with patient aware of new shipping Ship 11/24 for 11/25

## 2024-11-20 ENCOUNTER — Other Ambulatory Visit: Payer: Self-pay

## 2024-11-21 ENCOUNTER — Other Ambulatory Visit (HOSPITAL_COMMUNITY): Payer: Self-pay

## 2024-11-28 ENCOUNTER — Other Ambulatory Visit (HOSPITAL_COMMUNITY): Payer: Self-pay

## 2024-11-28 ENCOUNTER — Other Ambulatory Visit: Payer: Self-pay

## 2024-12-11 ENCOUNTER — Other Ambulatory Visit: Payer: Self-pay

## 2024-12-11 NOTE — Progress Notes (Signed)
 Specialty Pharmacy Refill Coordination Note  Yolanda Lawson is a 48 y.o. female contacted today regarding refills of specialty medication(s) Etanercept  (Enbrel  SureClick)   Patient requested (Patient-Rptd) Delivery   Delivery date: 12/14/24   Verified address: [address] [place], [place] [address]   Medication will be filled on: 12/13/24

## 2024-12-13 ENCOUNTER — Other Ambulatory Visit: Payer: Self-pay

## 2024-12-14 ENCOUNTER — Other Ambulatory Visit (HOSPITAL_COMMUNITY): Payer: Self-pay

## 2024-12-15 ENCOUNTER — Other Ambulatory Visit (HOSPITAL_COMMUNITY): Payer: Self-pay

## 2024-12-15 ENCOUNTER — Other Ambulatory Visit (HOSPITAL_BASED_OUTPATIENT_CLINIC_OR_DEPARTMENT_OTHER): Payer: Self-pay

## 2024-12-15 MED ORDER — ESCITALOPRAM OXALATE 10 MG PO TABS
10.0000 mg | ORAL_TABLET | Freq: Every day | ORAL | 3 refills | Status: AC
  Filled 2024-12-15: qty 30, 30d supply, fill #0
  Filled 2025-01-16: qty 30, 30d supply, fill #1

## 2024-12-24 ENCOUNTER — Other Ambulatory Visit (HOSPITAL_COMMUNITY): Payer: Self-pay

## 2024-12-25 ENCOUNTER — Other Ambulatory Visit (HOSPITAL_COMMUNITY): Payer: Self-pay

## 2024-12-25 MED ORDER — DIAZEPAM 5 MG PO TABS
5.0000 mg | ORAL_TABLET | Freq: Two times a day (BID) | ORAL | 2 refills | Status: AC | PRN
  Filled 2024-12-25: qty 60, 30d supply, fill #0

## 2024-12-25 MED ORDER — PROPRANOLOL HCL 60 MG PO TABS
120.0000 mg | ORAL_TABLET | Freq: Every day | ORAL | 1 refills | Status: DC
  Filled 2024-12-25: qty 60, 30d supply, fill #0
  Filled 2025-01-24: qty 60, 30d supply, fill #1

## 2024-12-26 ENCOUNTER — Other Ambulatory Visit: Payer: Self-pay

## 2025-01-03 ENCOUNTER — Other Ambulatory Visit: Payer: Self-pay

## 2025-01-03 ENCOUNTER — Encounter (INDEPENDENT_AMBULATORY_CARE_PROVIDER_SITE_OTHER): Payer: Self-pay

## 2025-01-04 ENCOUNTER — Other Ambulatory Visit: Payer: Self-pay | Admitting: Pharmacy Technician

## 2025-01-04 ENCOUNTER — Other Ambulatory Visit: Payer: Self-pay

## 2025-01-04 NOTE — Progress Notes (Signed)
 Specialty Pharmacy Refill Coordination Note  Yolanda Lawson is a 49 y.o. female contacted today regarding refills of specialty medication(s) Etanercept  (Enbrel  SureClick)   Patient requested Delivery   Delivery date: 01/11/25   Verified address: [address] [place], [place] [address]   Medication will be filled on: 01/10/25

## 2025-01-10 ENCOUNTER — Other Ambulatory Visit: Payer: Self-pay

## 2025-01-16 ENCOUNTER — Other Ambulatory Visit (HOSPITAL_COMMUNITY): Payer: Self-pay

## 2025-01-17 ENCOUNTER — Other Ambulatory Visit (HOSPITAL_COMMUNITY): Payer: Self-pay

## 2025-01-17 ENCOUNTER — Other Ambulatory Visit (HOSPITAL_BASED_OUTPATIENT_CLINIC_OR_DEPARTMENT_OTHER): Payer: Self-pay

## 2025-01-17 ENCOUNTER — Other Ambulatory Visit: Payer: Self-pay

## 2025-01-17 MED ORDER — ESCITALOPRAM OXALATE 10 MG PO TABS
10.0000 mg | ORAL_TABLET | Freq: Every day | ORAL | 0 refills | Status: AC
  Filled 2025-01-17: qty 90, 90d supply, fill #0

## 2025-01-22 ENCOUNTER — Other Ambulatory Visit: Payer: Self-pay

## 2025-01-24 ENCOUNTER — Other Ambulatory Visit: Payer: Self-pay

## 2025-01-25 ENCOUNTER — Other Ambulatory Visit (HOSPITAL_COMMUNITY): Payer: Self-pay

## 2025-01-25 MED ORDER — GABAPENTIN 400 MG PO CAPS
400.0000 mg | ORAL_CAPSULE | Freq: Every day | ORAL | 0 refills | Status: AC
  Filled 2025-01-25: qty 30, 30d supply, fill #0

## 2025-01-28 ENCOUNTER — Other Ambulatory Visit (HOSPITAL_COMMUNITY): Payer: Self-pay

## 2025-01-28 MED ORDER — PROPRANOLOL HCL 60 MG PO TABS
120.0000 mg | ORAL_TABLET | Freq: Every day | ORAL | 1 refills | Status: AC
  Filled 2025-01-28: qty 60, 30d supply, fill #0

## 2025-01-30 ENCOUNTER — Other Ambulatory Visit: Payer: Self-pay

## 2025-01-31 ENCOUNTER — Other Ambulatory Visit: Payer: Self-pay

## 2025-01-31 ENCOUNTER — Other Ambulatory Visit (HOSPITAL_COMMUNITY): Payer: Self-pay

## 2025-01-31 NOTE — Progress Notes (Signed)
 Specialty Pharmacy Refill Coordination Note  Yolanda Lawson is a 49 y.o. female contacted today regarding refills of specialty medication(s) Etanercept  (Enbrel  SureClick)   Patient requested Delivery   Delivery date: 02/09/25   Verified address: [address] [place], [place] [address]   Medication will be filled on: 02/08/25
